# Patient Record
Sex: Female | Born: 1975 | Race: White | Hispanic: No | Smoking: Current every day smoker
Health system: Southern US, Community
[De-identification: ages and names within clinical notes are randomized; demographics above are authoritative.]

## PROBLEM LIST (undated history)

## (undated) DIAGNOSIS — G8929 Other chronic pain: Secondary | ICD-10-CM

## (undated) DIAGNOSIS — E282 Polycystic ovarian syndrome: Secondary | ICD-10-CM

## (undated) DIAGNOSIS — G930 Cerebral cysts: Secondary | ICD-10-CM

## (undated) DIAGNOSIS — F4310 Post-traumatic stress disorder, unspecified: Secondary | ICD-10-CM

## (undated) DIAGNOSIS — R0681 Apnea, not elsewhere classified: Secondary | ICD-10-CM

## (undated) DIAGNOSIS — J45909 Unspecified asthma, uncomplicated: Secondary | ICD-10-CM

## (undated) HISTORY — PX: KIDNEY SURGERY: SHX687

## (undated) HISTORY — PX: BACK SURGERY: SHX140

## (undated) HISTORY — PX: NECK SURGERY: SHX720

---

## 2017-08-04 ENCOUNTER — Encounter: Payer: Self-pay | Admitting: Emergency Medicine

## 2017-08-04 ENCOUNTER — Emergency Department
Admission: EM | Admit: 2017-08-04 | Discharge: 2017-08-04 | Disposition: A | Discharge status: Home / Self Care | Payer: Medicaid Other | Attending: Emergency Medicine | Admitting: Emergency Medicine

## 2017-08-04 ENCOUNTER — Emergency Department: Payer: Medicaid Other

## 2017-08-04 DIAGNOSIS — J45909 Unspecified asthma, uncomplicated: Secondary | ICD-10-CM | POA: Insufficient documentation

## 2017-08-04 DIAGNOSIS — Z91040 Latex allergy status: Secondary | ICD-10-CM | POA: Diagnosis not present

## 2017-08-04 DIAGNOSIS — F17200 Nicotine dependence, unspecified, uncomplicated: Secondary | ICD-10-CM | POA: Diagnosis not present

## 2017-08-04 DIAGNOSIS — R51 Headache: Secondary | ICD-10-CM | POA: Insufficient documentation

## 2017-08-04 DIAGNOSIS — R519 Headache, unspecified: Secondary | ICD-10-CM

## 2017-08-04 HISTORY — DX: Polycystic ovarian syndrome: E28.2

## 2017-08-04 HISTORY — DX: Unspecified asthma, uncomplicated: J45.909

## 2017-08-04 HISTORY — DX: Post-traumatic stress disorder, unspecified: F43.10

## 2017-08-04 HISTORY — DX: Apnea, not elsewhere classified: R06.81

## 2017-08-04 HISTORY — DX: Other chronic pain: G89.29

## 2017-08-04 HISTORY — DX: Cerebral cysts: G93.0

## 2017-08-04 LAB — POC URINE PREG, ED: PREG TEST UR: NEGATIVE

## 2017-08-04 LAB — URINALYSIS, COMPLETE (UACMP) WITH MICROSCOPIC
BACTERIA UA: NONE SEEN
BILIRUBIN URINE: NEGATIVE
Glucose, UA: NEGATIVE mg/dL
HGB URINE DIPSTICK: NEGATIVE
KETONES UR: NEGATIVE mg/dL
LEUKOCYTES UA: NEGATIVE
NITRITE: NEGATIVE
Protein, ur: NEGATIVE mg/dL
SPECIFIC GRAVITY, URINE: 1.021 (ref 1.005–1.030)
pH: 5 (ref 5.0–8.0)

## 2017-08-04 LAB — CBC WITH DIFFERENTIAL/PLATELET
BASOS PCT: 1 %
Basophils Absolute: 0 10*3/uL (ref 0–0.1)
EOS ABS: 0.3 10*3/uL (ref 0–0.7)
EOS PCT: 3 %
HCT: 38.3 % (ref 35.0–47.0)
HEMOGLOBIN: 12.6 g/dL (ref 12.0–16.0)
LYMPHS ABS: 2.4 10*3/uL (ref 1.0–3.6)
Lymphocytes Relative: 26 %
MCH: 28.4 pg (ref 26.0–34.0)
MCHC: 32.9 g/dL (ref 32.0–36.0)
MCV: 86.1 fL (ref 80.0–100.0)
MONOS PCT: 4 %
Monocytes Absolute: 0.4 10*3/uL (ref 0.2–0.9)
NEUTROS PCT: 66 %
Neutro Abs: 6.3 10*3/uL (ref 1.4–6.5)
PLATELETS: 192 10*3/uL (ref 150–440)
RBC: 4.45 MIL/uL (ref 3.80–5.20)
RDW: 17.5 % — AB (ref 11.5–14.5)
WBC: 9.3 10*3/uL (ref 3.6–11.0)

## 2017-08-04 LAB — COMPREHENSIVE METABOLIC PANEL
ALBUMIN: 3.4 g/dL — AB (ref 3.5–5.0)
ALT: 17 U/L (ref 14–54)
ANION GAP: 8 (ref 5–15)
AST: 19 U/L (ref 15–41)
Alkaline Phosphatase: 62 U/L (ref 38–126)
BUN: 12 mg/dL (ref 6–20)
CALCIUM: 8.7 mg/dL — AB (ref 8.9–10.3)
CHLORIDE: 104 mmol/L (ref 101–111)
CO2: 29 mmol/L (ref 22–32)
Creatinine, Ser: 0.73 mg/dL (ref 0.44–1.00)
GFR calc non Af Amer: >60 mL/min (ref >60)
GLUCOSE: 134 mg/dL — AB (ref 65–99)
POTASSIUM: 4.1 mmol/L (ref 3.5–5.1)
SODIUM: 141 mmol/L (ref 135–145)
Total Bilirubin: 0.4 mg/dL (ref 0.3–1.2)
Total Protein: 7 g/dL (ref 6.5–8.1)

## 2017-08-04 MED ORDER — PROMETHAZINE HCL 25 MG/ML IJ SOLN
25.0000 mg | Freq: Once | INTRAMUSCULAR | Status: AC
  Administered 2017-08-04: 25 mg via INTRAVENOUS
  Filled 2017-08-04: qty 1

## 2017-08-04 MED ORDER — OXYCODONE-ACETAMINOPHEN 10-325 MG PO TABS: 1.0000 | ORAL_TABLET | Freq: Four times a day (QID) | ORAL | 0 refills | Status: DC | PRN

## 2017-08-04 MED ORDER — MORPHINE SULFATE (PF) 4 MG/ML IV SOLN
4.0000 mg | Freq: Once | INTRAVENOUS | Status: AC
  Administered 2017-08-04: 4 mg via INTRAVENOUS
  Filled 2017-08-04: qty 1

## 2017-08-04 MED ORDER — CYCLOBENZAPRINE HCL 10 MG PO TABS: 10.0000 mg | ORAL_TABLET | Freq: Three times a day (TID) | ORAL | 0 refills | Status: DC | PRN

## 2017-08-04 MED ORDER — SODIUM CHLORIDE 0.9 % IV BOLUS (SEPSIS)
1000.0000 mL | Freq: Once | INTRAVENOUS | Status: AC
  Administered 2017-08-04: 1000 mL via INTRAVENOUS

## 2017-08-04 NOTE — ED Notes (Signed)
Pt to CT
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
## 2017-08-04 NOTE — ED Notes (Signed)
Pt sleeping upon RN entering room. Pt woke while RN was talking to family members and reported pain had decreased to a=8 out of 10 but verbalized she is still uncomfortable.

## 2017-08-04 NOTE — Discharge Instructions (Signed)
Take motrin for pain.   Take flexeril for muscle spasms.   Take percocet for severe pain. DO NOT drive with it   You need to find a primary care doctor to prescribe your pain medicines   Return to ER if you have worse headaches, vomiting, neck pain, fever

## 2017-09-08 ENCOUNTER — Emergency Department (HOSPITAL_COMMUNITY)
Admission: EM | Admit: 2017-09-08 | Discharge: 2017-09-09 | Disposition: A | Discharge status: Home / Self Care | Payer: Medicaid Other | Attending: Emergency Medicine | Admitting: Emergency Medicine

## 2017-09-08 ENCOUNTER — Encounter (HOSPITAL_COMMUNITY): Payer: Self-pay

## 2017-09-08 DIAGNOSIS — F17200 Nicotine dependence, unspecified, uncomplicated: Secondary | ICD-10-CM | POA: Diagnosis not present

## 2017-09-08 DIAGNOSIS — J45909 Unspecified asthma, uncomplicated: Secondary | ICD-10-CM | POA: Insufficient documentation

## 2017-09-08 DIAGNOSIS — Z91040 Latex allergy status: Secondary | ICD-10-CM | POA: Diagnosis not present

## 2017-09-08 DIAGNOSIS — M542 Cervicalgia: Secondary | ICD-10-CM | POA: Diagnosis not present

## 2017-09-08 NOTE — ED Triage Notes (Signed)
Pt comes today for chronic pain in her head, neck, back and shoulder, pt states that she has had surgery on her neck before and needs to have another one but has not established a doctor here. Pain has got worse in the past two days, pain unrelieved by flexeril and states she is out of her percocet

## 2017-09-09 MED ORDER — OXYCODONE-ACETAMINOPHEN 5-325 MG PO TABS
1.0000 | ORAL_TABLET | ORAL | Status: DC | PRN
  Administered 2017-09-09: 1 via ORAL
  Filled 2017-09-09: qty 1

## 2017-09-09 MED ORDER — OXYCODONE-ACETAMINOPHEN 5-325 MG PO TABS: 1.0000 | ORAL_TABLET | Freq: Three times a day (TID) | ORAL | 0 refills | Status: DC | PRN
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
## 2017-09-09 NOTE — Discharge Instructions (Signed)
As discussed, follow up to establish care with a primary care provider and orthopedic to reschedule your planned surgery locally as soon as possible. Take pain medications as needed for severe pain.  Follow up with wellness center and orthopedics. Return if symptoms worsen or you experience new concerning symptoms in the meantime.

## 2017-09-09 NOTE — ED Notes (Signed)
Patient routinely takes Percocet and Flexeril. Ok for DC.

## 2018-03-17 ENCOUNTER — Emergency Department
Admission: EM | Admit: 2018-03-17 | Discharge: 2018-03-17 | Disposition: A | Discharge status: Home / Self Care | Payer: Medicaid Other | Attending: Emergency Medicine | Admitting: Emergency Medicine

## 2018-03-17 ENCOUNTER — Encounter: Payer: Self-pay | Admitting: Emergency Medicine

## 2018-03-17 DIAGNOSIS — Z91040 Latex allergy status: Secondary | ICD-10-CM | POA: Diagnosis not present

## 2018-03-17 DIAGNOSIS — K029 Dental caries, unspecified: Secondary | ICD-10-CM

## 2018-03-17 DIAGNOSIS — J45909 Unspecified asthma, uncomplicated: Secondary | ICD-10-CM | POA: Insufficient documentation

## 2018-03-17 DIAGNOSIS — K047 Periapical abscess without sinus: Secondary | ICD-10-CM | POA: Diagnosis not present

## 2018-03-17 DIAGNOSIS — K0889 Other specified disorders of teeth and supporting structures: Secondary | ICD-10-CM | POA: Diagnosis present

## 2018-03-17 DIAGNOSIS — F17200 Nicotine dependence, unspecified, uncomplicated: Secondary | ICD-10-CM | POA: Diagnosis not present

## 2018-03-17 MED ORDER — CLINDAMYCIN HCL 150 MG PO CAPS: ORAL_CAPSULE | ORAL | 0 refills | Status: DC

## 2018-03-17 MED ORDER — TRAMADOL HCL 50 MG PO TABS
50.0000 mg | ORAL_TABLET | Freq: Once | ORAL | Status: AC
  Administered 2018-03-17: 50 mg via ORAL

## 2018-03-17 MED ORDER — TRAMADOL HCL 50 MG PO TABS
ORAL_TABLET | ORAL | Status: AC
  Filled 2018-03-17: qty 1

## 2018-03-17 MED ORDER — TRAMADOL HCL 50 MG PO TABS: 50.0000 mg | ORAL_TABLET | Freq: Four times a day (QID) | ORAL | 0 refills | Status: DC | PRN

## 2018-03-17 NOTE — ED Notes (Signed)
Pt to ED with rt upper tooth pain. Pt states she broke her tooth x2weeks ago. Swelling noted.

## 2018-03-17 NOTE — ED Provider Notes (Signed)
[facility] Emergency Department Provider Note  ___________________________________________   First MD Initiated Contact with Patient 03/17/18 1203     (approximate)  I have reviewed the triage vital signs and the nursing notes.   HISTORY  Chief Complaint Dental Pain  HPI Connie Koch is a 42 y.o. female is here with complaint of right sided dental pain.  Patient states that she broke her tooth 2 weeks ago but is having pain and swelling today.  She has not made a dentist appointment if she does not have "dental insurance".  Patient continues to smoke.  She states she has not taken any over-the-counter medication but does state that she has been taking "leftover Percocet" which she ran out of.  Denies any fever or chills.  She rates her pain is not over 10.   Past Medical History:  Diagnosis Date  . Apnea   . Asthma   . Chronic pain   . Cyst of brain   . PCOS (polycystic ovarian syndrome)   . PTSD (post-traumatic stress disorder)     There are no active problems to display for this patient.   Past Surgical History:  Procedure Laterality Date  . BACK SURGERY    . KIDNEY SURGERY    . NECK SURGERY      Prior to Admission medications   Medication Sig Start Date End Date Taking? Authorizing Provider  clindamycin (CLEOCIN) 150 MG capsule Take 2 capsules every 6 hours for infection. 03/17/18   Tommi RumpsSummers, Rhonda L, PA-C  traMADol (ULTRAM) 50 MG tablet Take 1 tablet (50 mg total) by mouth every 6 (six) hours as needed. 03/17/18   Tommi RumpsSummers, Rhonda L, PA-C    Allergies Nsaids; Latex; and Penicillins  No family history on file.  Social History Social History   Tobacco Use  . Smoking status: Current Every Day Smoker  . Smokeless tobacco: Never Used  Substance Use Topics  . Alcohol use: Yes  . Drug use: No    Review of Systems Constitutional: No fever/chills Eyes: No visual changes. ENT: No sore throat.  Positive dental  pain. Cardiovascular: Denies chest pain. Respiratory: Denies shortness of breath. Gastrointestinal: No abdominal pain.  No nausea, no vomiting.  Musculoskeletal: Negative for back pain. Skin: Negative for rash. Neurological: Negative for headaches, focal weakness or numbness. ___________________________________________   PHYSICAL EXAM:  VITAL SIGNS: ED Triage Vitals  Enc Vitals Group     BP 03/17/18 1156 (!) 142/91     Pulse Rate 03/17/18 1156 77     Resp 03/17/18 1156 16     Temp 03/17/18 1156 98.7 F (37.1 C)     Temp Source 03/17/18 1156 Oral     SpO2 03/17/18 1156 93 %     Weight 03/17/18 1151 280 lb (127 kg)     Height 03/17/18 1151 5\' 2"  (1.575 m)     Head Circumference --      Peak Flow --      Pain Score 03/17/18 1151 9     Pain Loc --      Pain Edu? --      Excl. in GC? --    Constitutional: Alert and oriented. Well appearing and in no acute distress. Eyes: Conjunctivae are normal.  Head: Atraumatic. Nose: No congestion/rhinnorhea. Mouth/Throat: Mucous membranes are moist.  Oropharynx non-erythematous.  Right upper premolar there is a cavity present.  There is no obvious signs of infection.  There is no drainage present. Neck: No stridor.  Hematological/Lymphatic/Immunilogical: No cervical lymphadenopathy. Cardiovascular: Normal rate, regular rhythm. Grossly normal heart sounds.  Good peripheral circulation. Respiratory: Normal respiratory effort.  No retractions. Lungs CTAB. Neurologic:  Normal speech and language. No gross focal neurologic deficits are appreciated.  Skin:  Skin is warm, dry and intact. Psychiatric: Mood and affect are normal. Speech and behavior are normal.  ____________________________________________   LABS (all labs ordered are listed, but only abnormal results are displayed)  Labs Reviewed - No data to display   PROCEDURES  Procedure(s) performed: None  Procedures  Critical Care performed:  No  ____________________________________________   INITIAL IMPRESSION / ASSESSMENT AND PLAN / ED COURSE Patient is here with dental pain for 2 weeks.  She was given a prescription for clindamycin and tramadol 50 milligrams 1 every 6 hours as needed for pain.  She was given a list of dental clinics in the area to follow-up with.  ____________________________________________   FINAL CLINICAL IMPRESSION(S) / ED DIAGNOSES  Final diagnoses:  Dental abscess  Pain due to dental caries     ED Discharge Orders        Ordered    traMADol (ULTRAM) 50 MG tablet  Every 6 hours PRN     03/17/18 1226    clindamycin (CLEOCIN) 150 MG capsule     03/17/18 1226       Note:  This document was prepared using Dragon voice recognition software and may include unintentional dictation errors.    Tommi Rumps, PA-C 03/17/18 1244    Governor Rooks, MD 03/17/18 [phone]

## 2018-03-17 NOTE — ED Triage Notes (Signed)
Patient presents to the ED with right sided dental pain.  Patient states she broke her tooth 2 weeks ago but today is having pain and swelling.  Patient is in no obvious distress at this time.

## 2018-03-17 NOTE — Discharge Instructions (Addendum)
Begin taking antibiotics as directed with the clindamycin.  2 capsules every 6 hours for infection.  Tramadol 1 every 6 hours as needed for pain.  Do not drive or operate machinery while taking the tramadol. A list of dental clinics is provided for you.  You will need to see a dentist.  These clinics are on a sliding scale.  Also the dental clinic at [facility] takes walk-ins.  OPTIONS FOR DENTAL FOLLOW UP CARE  [place] Department of Health and Human Services - Local Safety Net Dental Clinics [email or web address]://[email or web address].gov/dph/oralhealth/services/safetynetclinics.htm   [facility] [phone])  [facility] [phone])  [facility] [phone]  [facility] [phone])  [facility] [phone]) This clinic caters to the indigent population and is on a lottery system. Location: [employer] [address] [address], [address], [place] Clinic Hours: Wednesdays from 6pm - 9pm, patients seen by a lottery system. For dates, call or go to [email or web address] Services: Cleanings, fillings and simple extractions. Payment Options: DENTAL WORK IS FREE OF CHARGE. Bring proof of income or support. Best way to get seen: Arrive at 5:15 pm - this is a lottery, NOT first come/first serve, so arriving earlier will not increase your chances of being seen.     [facility] [phone] Select option 1 for emergencies   Location: [facility], [facility], [address], [place] Clinic Hours: No walk-ins accepted - call the day before to schedule an appointment. Check in times are 9:30 am and 1:30 pm. Services: Simple extractions, temporary fillings, pulpectomy/pulp debridement, uncomplicated abscess drainage. Payment Options: PAYMENT IS DUE AT THE TIME OF SERVICE.  Fee is usually $100-200, additional surgical procedures (e.g. abscess drainage) may be  extra. Cash, checks, Visa/MasterCard accepted.  Can file Medicaid if patient is covered for dental - patient should call case worker to check. No discount for [facility] patients. Best way to get seen: MUST call the day before and get onto the schedule. Can usually be seen the next 1-2 days. No walk-ins accepted.     [facility] [phone]   Location: [facility], [address], [place] Clinic Hours: M, W, Th, F 8am or 1:30pm, Tues 9a or 1:30 - first come/first served. Services: Simple extractions, temporary fillings, uncomplicated abscess drainage.  You do not need to be an [facility] resident. Payment Options: PAYMENT IS DUE AT THE TIME OF SERVICE. Dental insurance, otherwise sliding scale - bring proof of income or support. Depending on income and treatment needed, cost is usually $50-200. Best way to get seen: Arrive early as it is first come/first served.     [facility] [phone]   Location: [address] Clinic Hours: Mon-Thu 8a-5p Services: Most basic dental services including extractions and fillings. Payment Options: PAYMENT IS DUE AT THE TIME OF SERVICE. Sliding scale, up to 50% off - bring proof if income or support. Medicaid with dental option accepted. Best way to get seen: Call to schedule an appointment, can usually be seen within 2 weeks OR they will try to see walk-ins - show up at 8a or 2p (you may have to wait).     [facility] [phone] [place] RESIDENTS ONLY   Location: [facility], [address], [place], [place] [address] Clinic Hours: By appointment only. Monday - Thursday 8am-5pm, Friday 8am-12pm Services: Cleanings, fillings, extractions. Payment Options: PAYMENT IS DUE  AT THE TIME OF SERVICE. Cash, Visa or MasterCard. Sliding scale - $30 minimum per service. Best way to get seen: Come in to office,  complete packet and make an appointment - need proof of income or support monies for each household member and proof of [facility] residence. Usually takes about a month to get in.     [facility] [phone]   Location: [address] [place] Clinic Hours: Walk-in Urgent Care Dental Services are offered Monday-Friday mornings only. The numbers of emergencies accepted daily is limited to the number of providers available. Maximum 15 - Mondays, Wednesdays & Thursdays Maximum 10 - Tuesdays & Fridays Services: You do not need to be a [facility] resident to be seen for a dental emergency. Emergencies are defined as pain, swelling, abnormal bleeding, or dental trauma. Walkins will receive x-rays if needed. NOTE: Dental cleaning is not an emergency. Payment Options: PAYMENT IS DUE AT THE TIME OF SERVICE. Minimum co-pay is $40.00 for uninsured patients. Minimum co-pay is $3.00 for Medicaid with dental coverage. Dental Insurance is accepted and must be presented at time of visit. Medicare does not cover dental. Forms of payment: Cash, credit card, checks. Best way to get seen: If not previously registered with the clinic, walk-in dental registration begins at 7:15 am and is on a first come/first serve basis. If previously registered with the clinic, call to make an appointment.     [facility] ONLY   Location: [address] [place], [place] Clinic Hours: Mon-Thu 10a-2p Services: Extractions only! Payment Options: FREE (donations accepted) - bring proof of income or support Best way to get seen: Call and schedule an appointment OR come at 8am on the 1st Monday of every month (except for holidays) when it is first come/first served.     [facility] [phone]   Location: [place], [place] Clinic Hours: Friday mornings Services, Payment Options, Best way to get seen: Call for  info

## 2018-03-29 ENCOUNTER — Ambulatory Visit: Payer: Medicaid Other | Admitting: Pharmacy Technician

## 2018-03-29 DIAGNOSIS — Z79899 Other long term (current) drug therapy: Secondary | ICD-10-CM

## 2018-05-13 ENCOUNTER — Encounter: Payer: Self-pay | Admitting: *Deleted

## 2018-05-13 ENCOUNTER — Emergency Department
Admission: EM | Admit: 2018-05-13 | Discharge: 2018-05-13 | Disposition: A | Discharge status: Home / Self Care | Payer: Medicaid Other | Attending: Emergency Medicine | Admitting: Emergency Medicine

## 2018-05-13 ENCOUNTER — Other Ambulatory Visit: Payer: Self-pay

## 2018-05-13 DIAGNOSIS — F331 Major depressive disorder, recurrent, moderate: Secondary | ICD-10-CM

## 2018-05-13 DIAGNOSIS — Z760 Encounter for issue of repeat prescription: Secondary | ICD-10-CM

## 2018-05-13 DIAGNOSIS — Z91040 Latex allergy status: Secondary | ICD-10-CM | POA: Insufficient documentation

## 2018-05-13 DIAGNOSIS — F4310 Post-traumatic stress disorder, unspecified: Secondary | ICD-10-CM

## 2018-05-13 DIAGNOSIS — Z79899 Other long term (current) drug therapy: Secondary | ICD-10-CM | POA: Insufficient documentation

## 2018-05-13 DIAGNOSIS — J45909 Unspecified asthma, uncomplicated: Secondary | ICD-10-CM | POA: Insufficient documentation

## 2018-05-13 DIAGNOSIS — F17200 Nicotine dependence, unspecified, uncomplicated: Secondary | ICD-10-CM | POA: Insufficient documentation

## 2018-05-13 DIAGNOSIS — F4322 Adjustment disorder with anxiety: Secondary | ICD-10-CM

## 2018-05-13 MED ORDER — HYDROXYZINE HCL 10 MG PO TABS: 20.0000 mg | ORAL_TABLET | Freq: Two times a day (BID) | ORAL | 1 refills | Status: AC | PRN

## 2018-05-13 MED ORDER — FLUOXETINE HCL 20 MG PO CAPS: 20.0000 mg | ORAL_CAPSULE | Freq: Every day | ORAL | 1 refills | Status: AC

## 2018-05-13 NOTE — ED Provider Notes (Signed)
[facility] Emergency Department Provider Note  ____________________________________________  Time seen: Approximately 2:06 PM  I have reviewed the triage vital signs and the nursing notes.   HISTORY  Chief Complaint Medication Refill   HPI Connie Koch is a 42 y.o. female with a history of depression and PTSD who presents for evaluation of depression.  Patient reports that she recently moved here and has been having issues with [facility].  She has been trying to get an appointment for several weeks and is unable to get a hold of them.  She has no insurance and relies on them for her prescriptions. She was finally able to get an appointment to get her medications refilled today but she was 15 minutes late to her appointment and they refused to see her.  Patient has been out of her hydroxyzine and Prozac for a week.  She reports that she has been feeling very depressed, she is not leaving the house, she is been crying a lot, she has not had an appetite.  She denies suicidal homicidal ideation.  She was instructed by the secretary at [facility] to come to the emergency room for evaluation.  She denies any prior history of suicidal homicidal ideation.  Chief Complaint: depression Severity: moderate Duration: 1 week Context: in the setting of running out of her meds Modifying factors: better when she takes her meds Associated signs/symptoms:  No si or HI. + anhedonia, sleep difficulties  Past Medical History:  Diagnosis Date  . Apnea   . Asthma   . Chronic pain   . Cyst of brain   . PCOS (polycystic ovarian syndrome)   . PTSD (post-traumatic stress disorder)      Past Surgical History:  Procedure Laterality Date  . BACK SURGERY    . KIDNEY SURGERY    . NECK SURGERY      Prior to Admission medications   Medication Sig Start Date End Date Taking? Authorizing Provider  clindamycin (CLEOCIN) 150 MG capsule Take 2 capsules every 6 hours for infection.  03/17/18   Tommi Rumps, PA-C  FLUoxetine (PROZAC) 20 MG capsule Take 1 capsule (20 mg total) by mouth daily. 05/13/18   Clapacs, Jackquline Denmark, MD  hydrOXYzine (ATARAX/VISTARIL) 10 MG tablet Take 2 tablets (20 mg total) by mouth every 12 (twelve) hours as needed for anxiety. 05/13/18   Clapacs, Jackquline Denmark, MD  traMADol (ULTRAM) 50 MG tablet Take 1 tablet (50 mg total) by mouth every 6 (six) hours as needed. 03/17/18   Tommi Rumps, PA-C    Allergies Nsaids; Latex; and Penicillins  History reviewed. No pertinent family history.  Social History Social History   Tobacco Use  . Smoking status: Current Every Day Smoker  . Smokeless tobacco: Never Used  Substance Use Topics  . Alcohol use: Yes  . Drug use: No    Review of Systems  Constitutional: Negative for fever. Eyes: Negative for visual changes. ENT: Negative for sore throat. Neck: No neck pain  Cardiovascular: Negative for chest pain. Respiratory: Negative for shortness of breath. Gastrointestinal: Negative for abdominal pain, vomiting or diarrhea. Genitourinary: Negative for dysuria. Musculoskeletal: Negative for back pain. Skin: Negative for rash. Neurological: Negative for headaches, weakness or numbness. Psych: No SI or HI. + depression  ____________________________________________   PHYSICAL EXAM:  VITAL SIGNS: ED Triage Vitals  Enc Vitals Group     BP 05/13/18 1355 126/77     Pulse Rate 05/13/18 1355 94     Resp 05/13/18  1355 16     Temp 05/13/18 1355 98.1 F (36.7 C)     Temp Source 05/13/18 1355 Oral     SpO2 05/13/18 1355 95 %     Weight 05/13/18 1356 280 lb (127 kg)     Height --      Head Circumference --      Peak Flow --      Pain Score 05/13/18 1355 0     Pain Loc --      Pain Edu? --      Excl. in GC? --     Constitutional: Alert and oriented, crying, sad.  HEENT:      Head: Normocephalic and atraumatic.         Eyes: Conjunctivae are normal. Sclera is non-icteric.       Mouth/Throat:  Mucous membranes are moist.       Neck: Supple with no signs of meningismus. Cardiovascular: Regular rate and rhythm. No murmurs, gallops, or rubs. 2+ symmetrical distal pulses are present in all extremities. No JVD. Respiratory: Normal respiratory effort. Lungs are clear to auscultation bilaterally. No wheezes, crackles, or rhonchi.  Gastrointestinal: Soft, non tender, and non distended with positive bowel sounds. No rebound or guarding. Musculoskeletal: Nontender with normal range of motion in all extremities. No edema, cyanosis, or erythema of extremities. Neurologic: Normal speech and language. Face is symmetric. Moving all extremities. No gross focal neurologic deficits are appreciated. Skin: Skin is warm, dry and intact. No rash noted. Psychiatric: Mood and affect are depressed. Patient is teary. Speech and behavior are normal.  ____________________________________________   LABS (all labs ordered are listed, but only abnormal results are displayed)  Labs Reviewed - No data to display ____________________________________________  EKG  none  ____________________________________________  RADIOLOGY  none  ____________________________________________   PROCEDURES  Procedure(s) performed: None Procedures Critical Care performed:  None ____________________________________________   INITIAL IMPRESSION / ASSESSMENT AND PLAN / ED COURSE  42 y.o. female with a history of depression and PTSD who presents for evaluation of depression in the setting of running out of her medications a week ago.  Patient missed her appointment today and only has an appointment available now in a month.  She has been very depressed sick, has anhedonia, decreased appetite, trouble sleeping.  She has no other medical complaints.  She is here voluntarily.  She has no SI or HI and no indication for involuntary commitment.  Will consult psychiatry for evaluation.    _________________________ 2:59 PM on  05/13/2018 ----------------------------------------- Patient evaluated by Dr. Toni Amend and cleared for DC. She was given Rx for her meds by him.     As part of my medical decision making, I reviewed the following data within the electronic MEDICAL RECORD NUMBER Nursing notes reviewed and incorporated, A consult was requested and obtained from this/these consultant(s) Psychiatry, Notes from prior ED visits and [place] Controlled Substance Database    Pertinent labs & imaging results that were available during my care of the patient were reviewed by me and considered in my medical decision making (see chart for details).    ____________________________________________   FINAL CLINICAL IMPRESSION(S) / ED DIAGNOSES  Final diagnoses:  Moderate episode of recurrent major depressive disorder (HCC)  Medication refill      NEW MEDICATIONS STARTED DURING THIS VISIT:  ED Discharge Orders        Ordered    FLUoxetine (PROZAC) 20 MG capsule  Daily     05/13/18 1458    hydrOXYzine (ATARAX/VISTARIL) 10  MG tablet  Every 12 hours PRN     05/13/18 1458       Note:  This document was prepared using Dragon voice recognition software and may include unintentional dictation errors.    Don Perking, [place], MD 05/13/18 [phone]

## 2018-05-13 NOTE — ED Triage Notes (Addendum)
Pt to ED requesting a medication refill for  Hydroxazine and Prozac. Pt attempted to go to [facility] for refill but was told they did not have time to evaluate her and she would have to wait a couple weeks before she could be seen and refills could be prescribed. Pt to ED due to a week without the medication already causing  increased anxiety, decreased appetite and decreased desire to leave the house.  Pt denies SI/HI and denies needing to be evaluated for new psych complaints. Pt verbalized she knows what happens without her medications and just wants to avoid that.
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
## 2018-05-13 NOTE — Discharge Instructions (Signed)
You have been seen in the Emergency Department (ED)  today for a psychiatric complaint.  You have been evaluated by psychiatry and we believe you are safe to be discharged from the hospital.   ° °Please return to the Emergency Department (ED)  immediately if you have ANY thoughts of hurting yourself or anyone else, so that we may help you. ° °Please avoid alcohol and drug use. ° °Follow up with your doctor and/or therapist as soon as possible regarding today's ED  visit.  ° °You may call crisis hotline for [place] at [phone]. ° °

## 2018-05-13 NOTE — ED Notes (Signed)
Psychiatry at bedside.

## 2018-06-09 ENCOUNTER — Emergency Department
Admission: EM | Admit: 2018-06-09 | Discharge: 2018-06-09 | Disposition: A | Discharge status: Home / Self Care | Payer: Self-pay | Attending: Emergency Medicine | Admitting: Emergency Medicine

## 2018-06-09 ENCOUNTER — Other Ambulatory Visit: Payer: Self-pay

## 2018-06-09 ENCOUNTER — Encounter: Payer: Self-pay | Admitting: Emergency Medicine

## 2018-06-09 DIAGNOSIS — Z91040 Latex allergy status: Secondary | ICD-10-CM | POA: Insufficient documentation

## 2018-06-09 DIAGNOSIS — G43909 Migraine, unspecified, not intractable, without status migrainosus: Secondary | ICD-10-CM | POA: Insufficient documentation

## 2018-06-09 DIAGNOSIS — J45909 Unspecified asthma, uncomplicated: Secondary | ICD-10-CM | POA: Insufficient documentation

## 2018-06-09 DIAGNOSIS — F17210 Nicotine dependence, cigarettes, uncomplicated: Secondary | ICD-10-CM | POA: Insufficient documentation

## 2018-06-09 LAB — CBC WITH DIFFERENTIAL/PLATELET
BASOS ABS: 0 10*3/uL (ref 0–0.1)
BASOS PCT: 0 %
Eosinophils Absolute: 0.2 10*3/uL (ref 0–0.7)
Eosinophils Relative: 3 %
HEMATOCRIT: 42.4 % (ref 35.0–47.0)
Hemoglobin: 14.2 g/dL (ref 12.0–16.0)
LYMPHS PCT: 26 %
Lymphs Abs: 2.4 10*3/uL (ref 1.0–3.6)
MCH: 29.7 pg (ref 26.0–34.0)
MCHC: 33.4 g/dL (ref 32.0–36.0)
MCV: 89 fL (ref 80.0–100.0)
MONO ABS: 0.5 10*3/uL (ref 0.2–0.9)
MONOS PCT: 6 %
NEUTROS ABS: 5.8 10*3/uL (ref 1.4–6.5)
Neutrophils Relative %: 65 %
Platelets: 187 10*3/uL (ref 150–440)
RBC: 4.77 MIL/uL (ref 3.80–5.20)
RDW: 15.2 % — AB (ref 11.5–14.5)
WBC: 9 10*3/uL (ref 3.6–11.0)

## 2018-06-09 LAB — COMPREHENSIVE METABOLIC PANEL
ALBUMIN: 3.8 g/dL (ref 3.5–5.0)
ALK PHOS: 69 U/L (ref 38–126)
ALT: 14 U/L (ref 14–54)
AST: 16 U/L (ref 15–41)
Anion gap: 7 (ref 5–15)
BILIRUBIN TOTAL: 0.6 mg/dL (ref 0.3–1.2)
BUN: 13 mg/dL (ref 6–20)
CALCIUM: 8.5 mg/dL — AB (ref 8.9–10.3)
CO2: 27 mmol/L (ref 22–32)
CREATININE: 0.58 mg/dL (ref 0.44–1.00)
Chloride: 104 mmol/L (ref 101–111)
GFR calc Af Amer: >60 mL/min (ref >60)
GLUCOSE: 101 mg/dL — AB (ref 65–99)
POTASSIUM: 4.1 mmol/L (ref 3.5–5.1)
Sodium: 138 mmol/L (ref 135–145)
TOTAL PROTEIN: 7.2 g/dL (ref 6.5–8.1)

## 2018-06-09 MED ORDER — HYDROMORPHONE HCL 1 MG/ML IJ SOLN: 0.5000 mg | Freq: Once | INTRAMUSCULAR | Status: DC

## 2018-06-09 MED ORDER — SODIUM CHLORIDE 0.9 % IV SOLN
Freq: Once | INTRAVENOUS | Status: AC
  Administered 2018-06-09: 14:00:00 via INTRAVENOUS

## 2018-06-09 MED ORDER — METOCLOPRAMIDE HCL 5 MG/ML IJ SOLN
10.0000 mg | Freq: Once | INTRAMUSCULAR | Status: AC
  Administered 2018-06-09: 10 mg via INTRAVENOUS
  Filled 2018-06-09: qty 2

## 2018-06-09 MED ORDER — HYDROMORPHONE HCL 1 MG/ML IJ SOLN
1.0000 mg | Freq: Once | INTRAMUSCULAR | Status: AC
  Administered 2018-06-09: 1 mg via INTRAVENOUS
  Filled 2018-06-09: qty 1

## 2018-06-09 MED ORDER — BUTALBITAL-APAP-CAFFEINE 50-325-40 MG PO TABS: 1.0000 | ORAL_TABLET | Freq: Four times a day (QID) | ORAL | 0 refills | Status: AC | PRN

## 2018-06-09 NOTE — ED Notes (Signed)
Informed RN that patient has been roomed and is ready for evaluation.  Patient in NAD at this time and call bell placed within reach.   

## 2018-06-09 NOTE — ED Triage Notes (Signed)
Pt comes into the ED via POV c/o headache that started yesterday.  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  Patient denies seeing her neurologist recently.  Patient denies any weakness.  Patient has even gait to triage at this time and is neurologically intact.  Patient having photosensitivity but in NAD with even and unlabored respirations.

## 2018-06-09 NOTE — ED Provider Notes (Signed)
[facility] Emergency Department Provider Note       Time seen: ----------------------------------------- 1:01 PM on 06/09/2018 -----------------------------------------   I have reviewed the triage vital signs and the nursing notes.  HISTORY   Chief Complaint Headache    HPI Connie Koch is a 42 y.o. female with a history of apnea, asthma, polycystic ovarian syndrome, PTSD who presents to the ED for headache that started yesterday.  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  Patient states she has recently been treated in [place], has not had scheduled follow-up yet here locally.  She cannot take NSAIDs due to anaphylactic reactions.  Currently is having pain posteriorly that radiates behind the right eye.  Light and sound seem to bother her.  Past Medical History:  Diagnosis Date  . Apnea   . Asthma   . Chronic pain   . Cyst of brain   . PCOS (polycystic ovarian syndrome)   . PTSD (post-traumatic stress disorder)     Patient Active Problem List   Diagnosis Date Noted  . PTSD (post-traumatic stress disorder) 05/13/2018  . Adjustment disorder with anxiety 05/13/2018    Past Surgical History:  Procedure Laterality Date  . BACK SURGERY    . KIDNEY SURGERY    . NECK SURGERY      Allergies Nsaids; Latex; and Penicillins  Social History Social History   Tobacco Use  . Smoking status: Current Every Day Smoker  . Smokeless tobacco: Never Used  Substance Use Topics  . Alcohol use: Yes  . Drug use: No   Review of Systems Constitutional: Negative for fever. Eyes: Positive for light sensitivity ENT:  Negative for congestion, sore throat Cardiovascular: Negative for chest pain. Respiratory: Negative for shortness of breath. Gastrointestinal: Negative for abdominal pain, positive for nausea and vomiting Musculoskeletal: Negative for back pain. Skin: Negative for rash. Neurological: Negative for  headaches, positive for hand paresthesia  All systems negative/normal/unremarkable except as stated in the HPI  ____________________________________________   PHYSICAL EXAM:  VITAL SIGNS: ED Triage Vitals  Enc Vitals Group     BP 06/09/18 1241 120/76     Pulse Rate 06/09/18 1241 66     Resp 06/09/18 1241 18     Temp 06/09/18 1241 97.9 F (36.6 C)     Temp Source 06/09/18 1241 Oral     SpO2 06/09/18 1241 95 %     Weight 06/09/18 1243 275 lb (124.7 kg)     Height 06/09/18 1243 5\' 2"  (1.575 m)     Head Circumference --      Peak Flow --      Pain Score 06/09/18 1244 7     Pain Loc --      Pain Edu? --      Excl. in GC? --    Constitutional: Alert and oriented. Well appearing and in no distress. Eyes: Conjunctivae are normal. Normal extraocular movements.  Mild photophobia is noted ENT   Head: Normocephalic and atraumatic.   Nose: No congestion/rhinnorhea.   Mouth/Throat: Mucous membranes are moist.   Neck: No stridor. Cardiovascular: Normal rate, regular rhythm. No murmurs, rubs, or gallops. Respiratory: Normal respiratory effort without tachypnea nor retractions. Breath sounds are clear and equal bilaterally. No wheezes/rales/rhonchi. Gastrointestinal: Soft and nontender. Normal bowel sounds Musculoskeletal: Nontender with normal range of motion in extremities. No lower extremity tenderness nor edema. Neurologic:  Normal speech and language. No gross focal neurologic deficits are appreciated.  Skin:  Skin  is warm, dry and intact. No rash noted. Psychiatric: Mood and affect are normal. Speech and behavior are normal.  ____________________________________________  ED COURSE:  As part of my medical decision making, I reviewed the following data within the electronic MEDICAL RECORD NUMBER History obtained from family if available, nursing notes, old chart and ekg, as well as notes from prior ED visits. Patient presented for likely migraine headache, we will assess with  labs and assess with IV headache cocktail   Procedures ____________________________________________   LABS (pertinent positives/negatives)  Labs Reviewed  CBC WITH DIFFERENTIAL/PLATELET - Abnormal; Notable for the following components:      Result Value   RDW 15.2 (*)    All other components within normal limits  COMPREHENSIVE METABOLIC PANEL - Abnormal; Notable for the following components:   Glucose, Bld 101 (*)    Calcium 8.5 (*)    All other components within normal limits   ____________________________________________  DIFFERENTIAL DIAGNOSIS   Migraine, tension headache, dehydration, electrolyte abnormality, anemia  FINAL ASSESSMENT AND PLAN  Migraine headache   Plan: The patient had presented for likely migraine headache. Patient's labs were unremarkable.  She has had significant improvement here, I will prescribe Fioricet for her.  She is cleared for outpatient follow-up.   Ulice Dash, MD   Note: This note was generated in part or whole with voice recognition software. Voice recognition is usually quite accurate but there are transcription errors that can and very often do occur. I apologize for any typographical errors that were not detected and corrected.     Emily Filbert, MD 06/09/18 1444

## 2018-08-30 ENCOUNTER — Encounter (INDEPENDENT_AMBULATORY_CARE_PROVIDER_SITE_OTHER): Payer: Self-pay

## 2018-08-30 ENCOUNTER — Ambulatory Visit: Payer: Self-pay | Attending: Oncology | Admitting: *Deleted

## 2018-08-30 VITALS — BP 127/85 | HR 79 | Temp 98.1°F | Ht 64.0 in | Wt 276.0 lb

## 2018-08-30 DIAGNOSIS — Z0000 Encounter for general adult medical examination without abnormal findings: Secondary | ICD-10-CM

## 2018-08-30 NOTE — Progress Notes (Signed)
  Subjective:     Patient ID: Connie Koch, female   DOB: 02-May-1976, 42 y.o.   MRN: [id number]  HPI   Review of Systems     Objective:   Physical Exam  Pulmonary/Chest: Right breast exhibits no inverted nipple, no mass, no nipple discharge, no skin change and no tenderness. Left breast exhibits no inverted nipple, no mass, no nipple discharge, no skin change and no tenderness. Breasts are asymmetrical.         Assessment:     42 year old self referral to [facility].  Patient states her last mammogram was in May 2018 in [place].  States at that time she was informed she needed a 6 month follow-up mammogram but has not returned.  Consent for release of information signed and sent to [place].  Patient is aware that we cannot schedule her mammogram until the images are received per the breast center's protocol.  Last pap per patient was also in [place] in 2018.  Clinical breast exam is unremarkable.  There is no dominant mass, skin changes, nipple discharge or lymphadenopathy.  The right breast is larger than the left.  The patient states this is normal for her.  Taught self breast awareness. Patient has been screened for eligibility.  She does not have any insurance, Medicare or Medicaid.  She also meets financial eligibility.  Hand-out given on the Affordable Care Act. Risk Assessment    Risk Scores      08/30/2018   Last edited by: Jim Like, RN   5-year risk: 0.7 %   Lifetime risk: 11 %             Plan:     Will order the appropriate mammogram when previous images are available.  Patient is to call if she has not heard from [place] with 2 1/2 weeks.  Will follow-up per [facility] protocol.

## 2018-08-30 NOTE — Patient Instructions (Signed)
Gave patient hand-out, Women Staying Healthy, Active and Well from [facility], with education on breast health, pap smears, heart and colon health. 

## 2018-09-06 ENCOUNTER — Other Ambulatory Visit: Payer: Self-pay | Admitting: *Deleted

## 2018-09-06 ENCOUNTER — Inpatient Hospital Stay
Admission: RE | Admit: 2018-09-06 | Discharge: 2018-09-06 | Disposition: A | Discharge status: Home / Self Care | Payer: Self-pay | Source: Ambulatory Visit | Attending: *Deleted | Admitting: *Deleted

## 2018-09-06 DIAGNOSIS — Z9289 Personal history of other medical treatment: Secondary | ICD-10-CM

## 2018-09-07 ENCOUNTER — Other Ambulatory Visit: Payer: Self-pay | Admitting: Physician Assistant

## 2018-09-13 ENCOUNTER — Other Ambulatory Visit: Payer: Self-pay | Admitting: *Deleted

## 2018-09-13 DIAGNOSIS — Z0000 Encounter for general adult medical examination without abnormal findings: Secondary | ICD-10-CM

## 2018-09-21 ENCOUNTER — Ambulatory Visit
Admission: RE | Admit: 2018-09-21 | Discharge: 2018-09-21 | Disposition: A | Discharge status: Home / Self Care | Payer: Self-pay | Source: Ambulatory Visit | Attending: Oncology | Admitting: Oncology

## 2018-09-21 DIAGNOSIS — Z0000 Encounter for general adult medical examination without abnormal findings: Secondary | ICD-10-CM | POA: Insufficient documentation

## 2018-09-23 ENCOUNTER — Encounter: Payer: Self-pay | Admitting: *Deleted

## 2018-09-23 NOTE — Progress Notes (Signed)
Letter mailed from the [facility] to inform patient of her normal mammogram results.  Patient is to follow-up with annual screening in one year.  HSIS to [place]. 

## 2018-11-04 ENCOUNTER — Emergency Department
Admission: EM | Admit: 2018-11-04 | Discharge: 2018-11-04 | Disposition: A | Discharge status: Home / Self Care | Payer: Self-pay | Attending: Emergency Medicine | Admitting: Emergency Medicine

## 2018-11-04 ENCOUNTER — Emergency Department: Payer: Self-pay

## 2018-11-04 ENCOUNTER — Encounter: Payer: Self-pay | Admitting: *Deleted

## 2018-11-04 DIAGNOSIS — F17200 Nicotine dependence, unspecified, uncomplicated: Secondary | ICD-10-CM | POA: Insufficient documentation

## 2018-11-04 DIAGNOSIS — F4322 Adjustment disorder with anxiety: Secondary | ICD-10-CM | POA: Insufficient documentation

## 2018-11-04 DIAGNOSIS — Y929 Unspecified place or not applicable: Secondary | ICD-10-CM | POA: Insufficient documentation

## 2018-11-04 DIAGNOSIS — Y9389 Activity, other specified: Secondary | ICD-10-CM | POA: Insufficient documentation

## 2018-11-04 DIAGNOSIS — Z91040 Latex allergy status: Secondary | ICD-10-CM | POA: Insufficient documentation

## 2018-11-04 DIAGNOSIS — J45909 Unspecified asthma, uncomplicated: Secondary | ICD-10-CM | POA: Insufficient documentation

## 2018-11-04 DIAGNOSIS — S6710XA Crushing injury of unspecified finger(s), initial encounter: Secondary | ICD-10-CM

## 2018-11-04 DIAGNOSIS — Y998 Other external cause status: Secondary | ICD-10-CM | POA: Insufficient documentation

## 2018-11-04 DIAGNOSIS — S6702XA Crushing injury of left thumb, initial encounter: Secondary | ICD-10-CM | POA: Insufficient documentation

## 2018-11-04 DIAGNOSIS — Z79899 Other long term (current) drug therapy: Secondary | ICD-10-CM | POA: Insufficient documentation

## 2018-11-04 DIAGNOSIS — W231XXA Caught, crushed, jammed, or pinched between stationary objects, initial encounter: Secondary | ICD-10-CM | POA: Insufficient documentation

## 2018-11-04 MED ORDER — HYDROCODONE-ACETAMINOPHEN 5-325 MG PO TABS: 1.0000 | ORAL_TABLET | Freq: Four times a day (QID) | ORAL | 0 refills | Status: AC | PRN

## 2018-11-04 NOTE — ED Triage Notes (Signed)
PT to ED reporting she closed her left hand in a door a week ago and has  Continued pain and swelling to the left thumb.

## 2018-11-04 NOTE — Discharge Instructions (Signed)
Please follow up with orthopedics if not improving over the next few days.  Return to the ER for symptoms that change or worsen if unable to schedule an appointment.

## 2018-11-04 NOTE — ED Provider Notes (Signed)
[facility] Emergency Department Provider Note ____________________________________________  Time seen: Approximately 1:55 PM  I have reviewed the triage vital signs and the nursing notes.   HISTORY  Chief Complaint Finger Injury    HPI Connie Koch is a 42 y.o. female who presents to the emergency department for evaluation and treatment of hand injury after shutting her left hand in a door a week ago. She continues to have pain and swelling to the left thumb.   Past Medical History:  Diagnosis Date  . Apnea   . Asthma   . Chronic pain   . Cyst of brain   . PCOS (polycystic ovarian syndrome)   . PTSD (post-traumatic stress disorder)     Patient Active Problem List   Diagnosis Date Noted  . PTSD (post-traumatic stress disorder) 05/13/2018  . Adjustment disorder with anxiety 05/13/2018    Past Surgical History:  Procedure Laterality Date  . BACK SURGERY    . KIDNEY SURGERY    . NECK SURGERY      Prior to Admission medications   Medication Sig Start Date End Date Taking? Authorizing Provider  butalbital-acetaminophen-caffeine (FIORICET, ESGIC) [phone] MG tablet Take 1-2 tablets by mouth every 6 (six) hours as needed for headache. 06/09/18 06/09/19  Emily FilbertWilliams, Jonathan E, MD  clindamycin (CLEOCIN) 150 MG capsule Take 2 capsules every 6 hours for infection. 03/17/18   Tommi RumpsSummers, Rhonda L, PA-C  FLUoxetine (PROZAC) 20 MG capsule Take 1 capsule (20 mg total) by mouth daily. 05/13/18   Clapacs, Jackquline DenmarkJohn T, MD  HYDROcodone-acetaminophen (NORCO/VICODIN) 5-325 MG tablet Take 1 tablet by mouth every 6 (six) hours as needed for up to 3 days for severe pain. 11/04/18 11/07/18  Charvis Lightner, Rulon Eisenmengerari B, FNP  hydrOXYzine (ATARAX/VISTARIL) 10 MG tablet Take 2 tablets (20 mg total) by mouth every 12 (twelve) hours as needed for anxiety. 05/13/18   Clapacs, Jackquline DenmarkJohn T, MD  traMADol (ULTRAM) 50 MG tablet Take 1 tablet (50 mg total) by mouth every 6 (six) hours as needed.  03/17/18   Tommi RumpsSummers, Rhonda L, PA-C    Allergies Nsaids; Latex; and Penicillins  Family History  Problem Relation Age of Onset  . Breast cancer Paternal Grandmother     Social History Social History   Tobacco Use  . Smoking status: Current Every Day Smoker  . Smokeless tobacco: Never Used  Substance Use Topics  . Alcohol use: Yes  . Drug use: No    Review of Systems Constitutional: Negative for fever. Cardiovascular: Negative for chest pain. Respiratory: Negative for shortness of breath. Musculoskeletal: Positive for left thumb pain. Skin: Positive for diffuse swelling of the left thumb.  Neurological: Negative for decrease in sensation  ____________________________________________   PHYSICAL EXAM:  VITAL SIGNS: ED Triage Vitals  Enc Vitals Group     BP 11/04/18 1345 (!) 156/92     Pulse Rate 11/04/18 1345 96     Resp 11/04/18 1345 16     Temp 11/04/18 1345 98.2 F (36.8 C)     Temp Source 11/04/18 1345 Oral     SpO2 11/04/18 1345 99 %     Weight 11/04/18 1346 276 lb 0.3 oz (125.2 kg)     Height --      Head Circumference --      Peak Flow --      Pain Score 11/04/18 1345 8     Pain Loc --      Pain Edu? --      Excl. in GC? --  Constitutional: Alert and oriented. Well appearing and in no acute distress. Eyes: Conjunctivae are clear without discharge or drainage Head: Atraumatic Neck: Supple Respiratory: No cough. Respirations are even and unlabored. Musculoskeletal: Limited ROM of the left thumb. Neurologic: Neurovascularly intact.  Skin:Limited ROM of the left thumb. Diffuse swelling of the MCP and PIP.   Psychiatric: Affect and behavior are appropriate.  ____________________________________________   LABS (all labs ordered are listed, but only abnormal results are displayed)  Labs Reviewed - No data to display ____________________________________________  RADIOLOGY  No acute bony abnormality of the left  thumb. ____________________________________________   PROCEDURES  .Ortho Injury Treatment Date/Time: 11/04/2018 3:06 PM Performed by: Rosalio Loud Authorized by: Chinita Pester, FNP   Consent:    Consent obtained:  Verbal   Consent given by:  Patient   Risks discussed:  Restricted joint movementInjury location: finger Location details: left thumb Injury type: soft tissue Pre-procedure distal perfusion: normal Pre-procedure neurological function: normal Pre-procedure range of motion: reduced  Anesthesia: Local anesthesia used: no Immobilization: splint Splint type: static finger Supplies used: aluminum splint Post-procedure distal perfusion: normal Post-procedure neurological function: normal Post-procedure range of motion: unchanged     ____________________________________________   INITIAL IMPRESSION / ASSESSMENT AND PLAN / ED COURSE  Connie Koch is a 42 y.o. who presents to the emergency department for treatment and evaluation of left thumb pain after shutting it in a door over a week ago. X-ray is reassuring.  Patient instructed to follow-up with orthopedics if not improving over the week.  She was also instructed to return to the emergency department for symptoms that change or worsen if unable schedule an appointment with orthopedics or primary care.  Medications - No data to display  Pertinent labs & imaging results that were available during my care of the patient were reviewed by me and considered in my medical decision making (see chart for details).  _________________________________________   FINAL CLINICAL IMPRESSION(S) / ED DIAGNOSES  Final diagnoses:  Crushing injury of finger, initial encounter    ED Discharge Orders         Ordered    HYDROcodone-acetaminophen (NORCO/VICODIN) 5-325 MG tablet  Every 6 hours PRN     11/04/18 1448           If controlled substance prescribed during this visit, 12 month history viewed on  the [facility] prior to issuing an initial prescription for Schedule II or III opiod.    Chinita Pester, FNP 11/04/18 1508    Jeanmarie Plant, MD 11/04/18 [phone]

## 2018-11-04 NOTE — ED Notes (Signed)
Radiology to bedside. 

## 2019-03-23 IMAGING — DX DG FINGER THUMB 2+V*L*
3 series · 3 of 3 positions shown · non-contrast
Comparison: No prior.

CLINICAL DATA: Jammed left thumb.

EXAM:
LEFT THUMB 2+V

[finger ap]
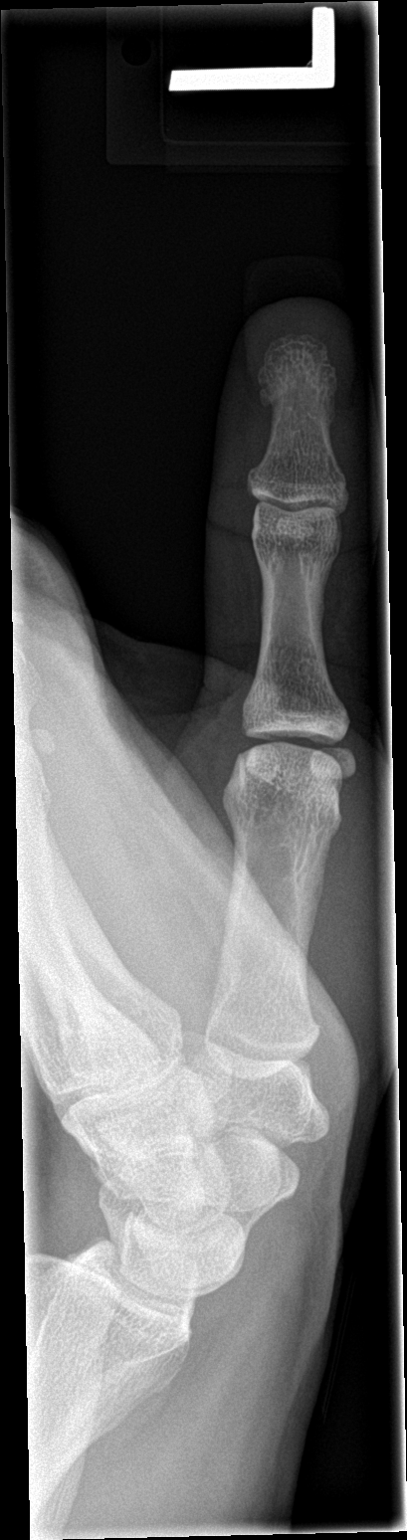

[finger obl]
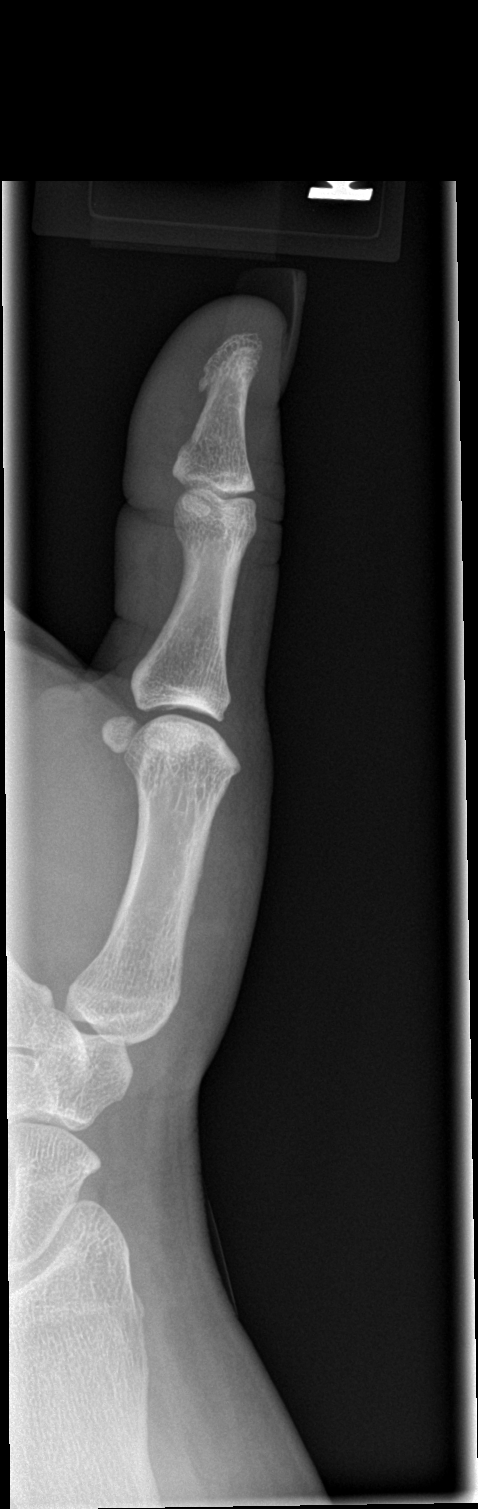

[finger lat]
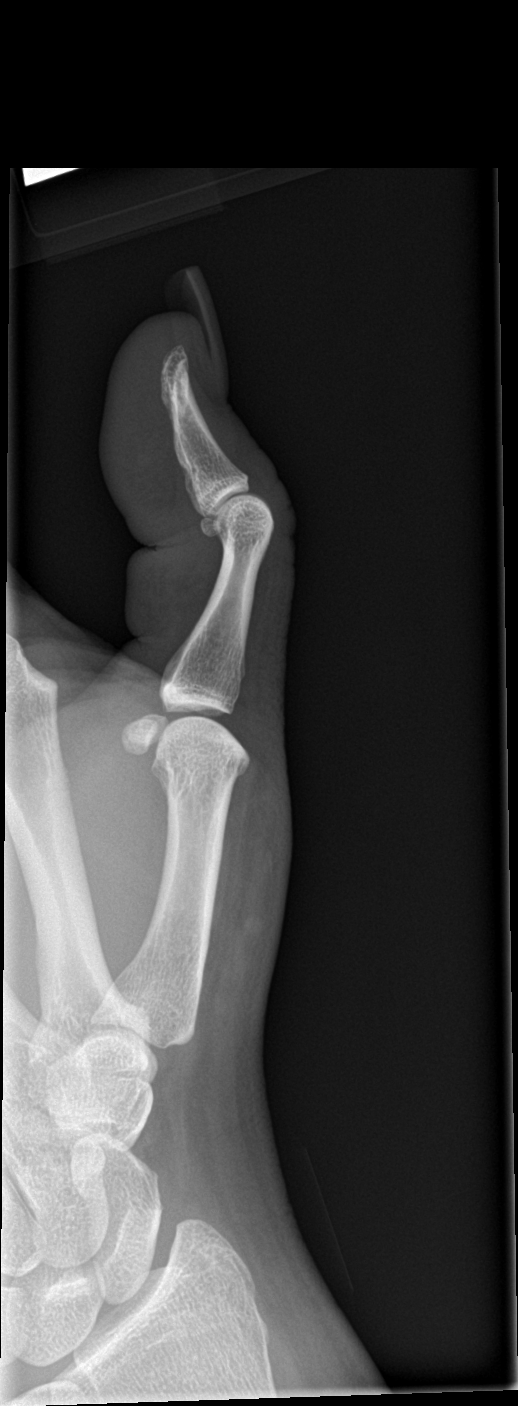

[3 of 3 positions shown; findings below may reference images not displayed]

FINDINGS: No acute or focal bony abnormality identified. No evidence of
fracture or dislocation.
IMPRESSION: No acute or focal abnormality.

## 2019-05-11 ENCOUNTER — Other Ambulatory Visit: Payer: Self-pay

## 2019-05-11 ENCOUNTER — Encounter: Payer: Self-pay | Admitting: Emergency Medicine

## 2019-05-11 ENCOUNTER — Emergency Department
Admission: EM | Admit: 2019-05-11 | Discharge: 2019-05-11 | Disposition: A | Discharge status: Home / Self Care | Payer: Self-pay | Attending: Emergency Medicine | Admitting: Emergency Medicine

## 2019-05-11 ENCOUNTER — Emergency Department: Payer: Self-pay

## 2019-05-11 DIAGNOSIS — W2209XA Striking against other stationary object, initial encounter: Secondary | ICD-10-CM | POA: Insufficient documentation

## 2019-05-11 DIAGNOSIS — J45909 Unspecified asthma, uncomplicated: Secondary | ICD-10-CM | POA: Insufficient documentation

## 2019-05-11 DIAGNOSIS — Z91040 Latex allergy status: Secondary | ICD-10-CM | POA: Insufficient documentation

## 2019-05-11 DIAGNOSIS — Y929 Unspecified place or not applicable: Secondary | ICD-10-CM | POA: Insufficient documentation

## 2019-05-11 DIAGNOSIS — S60221A Contusion of right hand, initial encounter: Secondary | ICD-10-CM

## 2019-05-11 DIAGNOSIS — Y999 Unspecified external cause status: Secondary | ICD-10-CM | POA: Insufficient documentation

## 2019-05-11 DIAGNOSIS — Z79899 Other long term (current) drug therapy: Secondary | ICD-10-CM | POA: Insufficient documentation

## 2019-05-11 DIAGNOSIS — Y939 Activity, unspecified: Secondary | ICD-10-CM | POA: Insufficient documentation

## 2019-05-11 DIAGNOSIS — F17200 Nicotine dependence, unspecified, uncomplicated: Secondary | ICD-10-CM | POA: Insufficient documentation

## 2019-05-11 MED ORDER — OXYCODONE-ACETAMINOPHEN 5-325 MG PO TABS
1.0000 | ORAL_TABLET | Freq: Once | ORAL | Status: AC
  Administered 2019-05-11: 1 via ORAL
  Filled 2019-05-11: qty 1

## 2019-05-11 NOTE — ED Triage Notes (Signed)
Patient ambulatory to triage with steady gait, without difficulty or distress noted; pt reports injuring right hand after "slamming it against a metal door" hr PTA

## 2019-05-11 NOTE — ED Provider Notes (Signed)
[facility] Emergency Department Provider Note __________________________   First MD Initiated Contact with Patient 05/11/19 [phone]     (approximate)  I have reviewed the triage vital signs and the nursing notes.   HISTORY  Chief Complaint Hand Injury   HPI Connie Koch is a 43 y.o. female presents to the emergency department with history of accidentally tripping and falling over her dog striking her right hand on a metal door with resultant 9 out of 10 pain to the base of the right fifth finger.        Past Medical History:  Diagnosis Date  . Apnea   . Asthma   . Chronic pain   . Cyst of brain   . PCOS (polycystic ovarian syndrome)   . PTSD (post-traumatic stress disorder)     Patient Active Problem List   Diagnosis Date Noted  . PTSD (post-traumatic stress disorder) 05/13/2018  . Adjustment disorder with anxiety 05/13/2018    Past Surgical History:  Procedure Laterality Date  . BACK SURGERY    . KIDNEY SURGERY    . NECK SURGERY      Prior to Admission medications   Medication Sig Start Date End Date Taking? Authorizing Provider  butalbital-acetaminophen-caffeine (FIORICET, ESGIC) [phone] MG tablet Take 1-2 tablets by mouth every 6 (six) hours as needed for headache. 06/09/18 06/09/19  Emily FilbertWilliams, Jonathan E, MD  clindamycin (CLEOCIN) 150 MG capsule Take 2 capsules every 6 hours for infection. 03/17/18   Tommi RumpsSummers, Rhonda L, PA-C  FLUoxetine (PROZAC) 20 MG capsule Take 1 capsule (20 mg total) by mouth daily. 05/13/18   Clapacs, Jackquline DenmarkJohn T, MD  hydrOXYzine (ATARAX/VISTARIL) 10 MG tablet Take 2 tablets (20 mg total) by mouth every 12 (twelve) hours as needed for anxiety. 05/13/18   Clapacs, Jackquline DenmarkJohn T, MD  traMADol (ULTRAM) 50 MG tablet Take 1 tablet (50 mg total) by mouth every 6 (six) hours as needed. 03/17/18   Tommi RumpsSummers, Rhonda L, PA-C    Allergies Nsaids; Latex; and Penicillins  Family History  Problem Relation Age of Onset  . Breast  cancer Paternal Grandmother     Social History Social History   Tobacco Use  . Smoking status: Current Every Day Smoker  . Smokeless tobacco: Never Used  Substance Use Topics  . Alcohol use: Yes  . Drug use: No    Review of Systems Constitutional: No fever/chills Eyes: No visual changes. ENT: No sore throat. Cardiovascular: Denies chest pain. Respiratory: Denies shortness of breath. Gastrointestinal: No abdominal pain.  No nausea, no vomiting.  No diarrhea.  No constipation. Genitourinary: Negative for dysuria. Musculoskeletal: Positive for right hand pain. Integumentary: Negative for rash. Neurological: Negative for headaches, focal weakness or numbness.   ____________________________________________   PHYSICAL EXAM:  VITAL SIGNS: ED Triage Vitals  Enc Vitals Group     BP 05/11/19 0338 (!) 152/98     Pulse Rate 05/11/19 0338 96     Resp 05/11/19 0338 20     Temp 05/11/19 0338 98.3 F (36.8 C)     Temp Source 05/11/19 0338 Oral     SpO2 05/11/19 0338 91 %     Weight 05/11/19 0335 127 kg (280 lb)     Height 05/11/19 0335 1.575 m (5\' 2" )     Head Circumference --      Peak Flow --      Pain Score 05/11/19 0334 9     Pain Loc --      Pain Edu? --  Excl. in GC? --     Constitutional: Alert and oriented. Well appearing and in no acute distress. Eyes: Conjunctivae are normal.  Head: Atraumatic. Mouth/Throat: Mucous membranes are moist.  Oropharynx non-erythematous. Neck: No stridor.  Cardiovascular: Normal rate, regular rhythm. Good peripheral circulation. Grossly normal heart sounds. Respiratory: Normal respiratory effort.  No retractions. No audible wheezing. Gastrointestinal: Soft and nontender. No distention.  Musculoskeletal: Swelling and ecchymosis noted dorsal aspect of the fifth metacarpal Neurologic:  Normal speech and language. No gross focal neurologic deficits are appreciated.  Skin:  Skin is warm, dry and intact. No rash noted.    ____________ RADIOLOGY I, Darci Current, personally viewed and evaluated these images (plain radiographs) as part of my medical decision making, as well as reviewing the written report by the radiologist.  ED MD interpretation:  Negative right hand xray  Official radiology report(s): Dg Hand Complete Right  Result Date: 05/11/2019 CLINICAL DATA:  Right hand injury. EXAM: RIGHT HAND - COMPLETE 3+ VIEW COMPARISON:  None. FINDINGS: There is no evidence of fracture or dislocation. There is no evidence of arthropathy or other focal bone abnormality. Soft tissues are unremarkable. IMPRESSION: Negative. Electronically Signed   By: Marnee Spring M.D.   On: 05/11/2019 04:18     Procedures   ____________________________________________   INITIAL IMPRESSION / MDM / ASSESSMENT AND PLAN / ED COURSE  As part of my medical decision making, I reviewed the following data within the electronic MEDICAL RECORD NUMBER   43 year old female present with above-stated history and physical exam secondary to right hand pain.  Concern for possible fracture.  X-ray revealed no evidence of fracture.  *Connie Koch was evaluated in Emergency Department on 05/11/2019 for the symptoms described in the history of present illness. She was evaluated in the context of the global COVID-19 pandemic, which necessitated consideration that the patient might be at risk for infection with the SARS-CoV-2 virus that causes COVID-19. Institutional protocols and algorithms that pertain to the evaluation of patients at risk for COVID-19 are in a state of rapid change based on information released by regulatory bodies including the CDC and federal and state organizations. These policies and algorithms were followed during the patient's care in the ED.  Some ED evaluations and interventions may be delayed as a result of limited staffing during the pandemic.*        ____________________________________________  FINAL  CLINICAL IMPRESSION(S) / ED DIAGNOSES  Final diagnoses:  Contusion of right hand, initial encounter     MEDICATIONS GIVEN DURING THIS VISIT:  Medications  oxyCODONE-acetaminophen (PERCOCET/ROXICET) 5-325 MG per tablet 1 tablet (1 tablet Oral Given 05/11/19 0425)     ED Discharge Orders    None       Note:  This document was prepared using Dragon voice recognition software and may include unintentional dictation errors.   Darci Current, MD 05/11/19 [phone]

## 2019-05-28 ENCOUNTER — Other Ambulatory Visit: Payer: Self-pay

## 2019-05-28 ENCOUNTER — Encounter: Payer: Self-pay | Admitting: Emergency Medicine

## 2019-05-28 ENCOUNTER — Emergency Department: Payer: Self-pay

## 2019-05-28 ENCOUNTER — Emergency Department
Admission: EM | Admit: 2019-05-28 | Discharge: 2019-05-28 | Disposition: A | Discharge status: Home / Self Care | Payer: Self-pay | Attending: Emergency Medicine | Admitting: Emergency Medicine

## 2019-05-28 DIAGNOSIS — S93602A Unspecified sprain of left foot, initial encounter: Secondary | ICD-10-CM | POA: Insufficient documentation

## 2019-05-28 DIAGNOSIS — F17210 Nicotine dependence, cigarettes, uncomplicated: Secondary | ICD-10-CM | POA: Insufficient documentation

## 2019-05-28 DIAGNOSIS — Z91040 Latex allergy status: Secondary | ICD-10-CM | POA: Insufficient documentation

## 2019-05-28 DIAGNOSIS — W010XXA Fall on same level from slipping, tripping and stumbling without subsequent striking against object, initial encounter: Secondary | ICD-10-CM | POA: Insufficient documentation

## 2019-05-28 DIAGNOSIS — Z79899 Other long term (current) drug therapy: Secondary | ICD-10-CM | POA: Insufficient documentation

## 2019-05-28 DIAGNOSIS — Y999 Unspecified external cause status: Secondary | ICD-10-CM | POA: Insufficient documentation

## 2019-05-28 DIAGNOSIS — Y929 Unspecified place or not applicable: Secondary | ICD-10-CM | POA: Insufficient documentation

## 2019-05-28 DIAGNOSIS — Y939 Activity, unspecified: Secondary | ICD-10-CM | POA: Insufficient documentation

## 2019-05-28 MED ORDER — PREDNISONE 50 MG PO TABS: 50.0000 mg | ORAL_TABLET | Freq: Every day | ORAL | 0 refills | Status: DC

## 2019-05-28 NOTE — ED Triage Notes (Signed)
Pt seen here on 05/11/19 after tripping over her dog; was seen for right hand pain but did not mention the right foot pain; pt says now she's back to work and the foot pain is worse; swelling present; pt says she tripped over her dog again today;

## 2019-05-28 NOTE — ED Notes (Signed)
Patient's L foot is slightly swollen, notified patient that she will be getting an xray

## 2019-05-28 NOTE — ED Notes (Signed)
Patient given crutches and post-op shoes

## 2019-05-28 NOTE — ED Provider Notes (Signed)
[facility] Emergency Department Provider Note  ____________________________________________  Time seen: Approximately 8:56 PM  I have reviewed the triage vital signs and the nursing notes.   HISTORY  Chief Complaint Foot Pain    HPI Connie Koch is a 43 y.o. female who presents the emergency department complaining of left foot pain.  Patient tripped over her dog 2 weeks ago.  She presented to the emergency department complaining of right wrist pain and was evaluated did not report the foot pain at that time.  Patient reports that she has had ongoing pain, swelling of the foot.  Primarily pain is located behind the second, third, fourth MTP joints.  Patient reports that she is able to bear weight and limited amount spent long periods of standing or walking drastically increase her pain.  No other injury or complaint at this time.         Past Medical History:  Diagnosis Date  . Apnea   . Asthma   . Chronic pain   . Cyst of brain   . PCOS (polycystic ovarian syndrome)   . PTSD (post-traumatic stress disorder)     Patient Active Problem List   Diagnosis Date Noted  . PTSD (post-traumatic stress disorder) 05/13/2018  . Adjustment disorder with anxiety 05/13/2018    Past Surgical History:  Procedure Laterality Date  . BACK SURGERY    . KIDNEY SURGERY    . NECK SURGERY      Prior to Admission medications   Medication Sig Start Date End Date Taking? Authorizing Provider  butalbital-acetaminophen-caffeine (FIORICET, ESGIC) [phone] MG tablet Take 1-2 tablets by mouth every 6 (six) hours as needed for headache. 06/09/18 06/09/19 Yes Emily FilbertWilliams, Robert Sunga E, MD  FLUoxetine (PROZAC) 20 MG capsule Take 1 capsule (20 mg total) by mouth daily. 05/13/18  Yes Clapacs, Jackquline DenmarkJohn T, MD  hydrOXYzine (ATARAX/VISTARIL) 10 MG tablet Take 2 tablets (20 mg total) by mouth every 12 (twelve) hours as needed for anxiety. 05/13/18  Yes Clapacs, Jackquline DenmarkJohn T, MD  traMADol  (ULTRAM) 50 MG tablet Take 1 tablet (50 mg total) by mouth every 6 (six) hours as needed. 03/17/18  Yes Tommi RumpsSummers, Rhonda L, PA-C  predniSONE (DELTASONE) 50 MG tablet Take 1 tablet (50 mg total) by mouth daily with breakfast. 05/28/19   Cesily Cuoco, Delorise RoyalsJonathan D, PA-C    Allergies Nsaids; Latex; and Penicillins  Family History  Problem Relation Age of Onset  . Breast cancer Paternal Grandmother     Social History Social History   Tobacco Use  . Smoking status: Current Every Day Smoker  . Smokeless tobacco: Never Used  Substance Use Topics  . Alcohol use: Yes  . Drug use: No     Review of Systems  Constitutional: No fever/chills Eyes: No visual changes. No discharge ENT: No upper respiratory complaints. Cardiovascular: no chest pain. Respiratory: no cough. No SOB. Gastrointestinal: No abdominal pain.  No nausea, no vomiting.  No diarrhea.  No constipation. Musculoskeletal: Positive for ongoing left foot pain after injury 2 weeks ago Skin: Negative for rash, abrasions, lacerations, ecchymosis. Neurological: Negative for headaches, focal weakness or numbness. 10-point ROS otherwise negative.  ____________________________________________   PHYSICAL EXAM:  VITAL SIGNS: ED Triage Vitals  Enc Vitals Group     BP 05/28/19 2040 (!) 164/88     Pulse Rate 05/28/19 2040 (!) 110     Resp 05/28/19 2040 17     Temp 05/28/19 2040 98.2 F (36.8 C)     Temp Source 05/28/19 2040  Oral     SpO2 05/28/19 2040 95 %     Weight 05/28/19 2041 279 lb 15.8 oz (127 kg)     Height 05/28/19 2041 5\' 2"  (1.575 m)     Head Circumference --      Peak Flow --      Pain Score 05/28/19 2041 7     Pain Loc --      Pain Edu? --      Excl. in GC? --      Constitutional: Alert and oriented. Well appearing and in no acute distress. Eyes: Conjunctivae are normal. PERRL. EOMI. Head: Atraumatic. Neck: No stridor.    Cardiovascular: Normal rate, regular rhythm. Normal S1 and S2.  Good peripheral  circulation. Respiratory: Normal respiratory effort without tachypnea or retractions. Lungs CTAB. Good air entry to the bases with no decreased or absent breath sounds. Musculoskeletal: Full range of motion to all extremities. No gross deformities appreciated.  Examination of the left foot reveals mild edema along the tarsal/metatarsal joint.  Patient has no appreciable edema or ecchymosis over the MTP joints.  Patient is minimally tender to palpation along the second, third, fourth tarsometatarsal junction.  Patient has very tender to palpation over the distal metatarsal bones of the second, third, fourth.  Dorsalis pedis pulse intact.  Sensation intact all digits.  Capillary refill less than 2 seconds all digits. Neurologic:  Normal speech and language. No gross focal neurologic deficits are appreciated.  Skin:  Skin is warm, dry and intact. No rash noted. Psychiatric: Mood and affect are normal. Speech and behavior are normal. Patient exhibits appropriate insight and judgement.   ____________________________________________   LABS (all labs ordered are listed, but only abnormal results are displayed)  Labs Reviewed - No data to display ____________________________________________  EKG   ____________________________________________  RADIOLOGY I personally viewed and evaluated these images as part of my medical decision making, as well as reviewing the written report by the radiologist.  I concur with radiologist finding of no acute osseous abnormality to the left foot  Dg Foot Complete Left  Result Date: 05/28/2019 CLINICAL DATA:  43 year old female with fall and left foot pain. EXAM: LEFT FOOT - COMPLETE 3+ VIEW COMPARISON:  None. FINDINGS: There is no acute fracture or dislocation. The bones are well mineralized. No arthritic changes. Mild soft tissue swelling of the midfoot. No radiopaque foreign object or soft tissue gas. IMPRESSION: No acute/traumatic osseous pathology. Electronically  Signed   By: Elgie CollardArash  Radparvar M.D.   On: 05/28/2019 21:32    ____________________________________________    PROCEDURES  Procedure(s) performed:    Procedures    Medications - No data to display   ____________________________________________   INITIAL IMPRESSION / ASSESSMENT AND PLAN / ED COURSE  Pertinent labs & imaging results that were available during my care of the patient were reviewed by me and considered in my medical decision making (see chart for details).  Review of the [place] CSRS was performed in accordance of the [facility] prior to dispensing any controlled drugs.           Patient's diagnosis is consistent with sprain of the left foot.  Patient presented to the emergency department complaining of left foot pain ongoing x2-1/2 weeks.  Patient reports ongoing foot pain that has not resolved.  Exam is overall reassuring.  X-ray reveals no acute osseous abnormality.  Patient is given postop shoe and crutches and referred to podiatry.  Patient is allergic to NSAIDs and will be placed on a  course of steroids. Patient is given ED precautions to return to the ED for any worsening or new symptoms.     ____________________________________________  FINAL CLINICAL IMPRESSION(S) / ED DIAGNOSES  Final diagnoses:  Sprain of left foot, initial encounter      NEW MEDICATIONS STARTED DURING THIS VISIT:  ED Discharge Orders         Ordered    predniSONE (DELTASONE) 50 MG tablet  Daily with breakfast     05/28/19 2211              This chart was dictated using voice recognition software/Dragon. Despite best efforts to proofread, errors can occur which can change the meaning. Any change was purely unintentional.    Darletta Moll, PA-C 05/28/19 2345    Nena Polio, MD 05/29/19 Laureen Abrahams

## 2019-12-31 ENCOUNTER — Emergency Department
Admission: EM | Admit: 2019-12-31 | Discharge: 2019-12-31 | Disposition: A | Discharge status: Home / Self Care | Payer: Self-pay | Attending: Emergency Medicine | Admitting: Emergency Medicine

## 2019-12-31 ENCOUNTER — Other Ambulatory Visit: Payer: Self-pay

## 2019-12-31 DIAGNOSIS — Z5321 Procedure and treatment not carried out due to patient leaving prior to being seen by health care provider: Secondary | ICD-10-CM | POA: Insufficient documentation

## 2019-12-31 DIAGNOSIS — R519 Headache, unspecified: Secondary | ICD-10-CM | POA: Insufficient documentation

## 2019-12-31 NOTE — ED Notes (Signed)
Patient called times one for a room with no answer.

## 2019-12-31 NOTE — ED Notes (Signed)
Pt called by this RN with no answer

## 2019-12-31 NOTE — ED Triage Notes (Signed)
Patient reports migraine for 3 days and is out of her regular medications.

## 2019-12-31 NOTE — ED Notes (Signed)
First Nurse: patient with complaint of migraine times three days and states that she ran out of her medications.

## 2019-12-31 NOTE — ED Notes (Signed)
3 attempts to call pt back to room. Taken off the board

## 2020-01-01 ENCOUNTER — Emergency Department: Payer: Self-pay

## 2020-01-01 ENCOUNTER — Other Ambulatory Visit: Payer: Self-pay

## 2020-01-01 ENCOUNTER — Ambulatory Visit
Admission: EM | Admit: 2020-01-01 | Discharge: 2020-01-01 | Payer: Self-pay | Attending: Family Medicine | Admitting: Family Medicine

## 2020-01-01 ENCOUNTER — Emergency Department
Admission: EM | Admit: 2020-01-01 | Discharge: 2020-01-01 | Disposition: A | Discharge status: Home / Self Care | Payer: Self-pay | Attending: Emergency Medicine | Admitting: Emergency Medicine

## 2020-01-01 ENCOUNTER — Encounter: Payer: Self-pay | Admitting: Emergency Medicine

## 2020-01-01 DIAGNOSIS — K1379 Other lesions of oral mucosa: Secondary | ICD-10-CM | POA: Insufficient documentation

## 2020-01-01 DIAGNOSIS — R1310 Dysphagia, unspecified: Secondary | ICD-10-CM

## 2020-01-01 DIAGNOSIS — T17308A Unspecified foreign body in larynx causing other injury, initial encounter: Secondary | ICD-10-CM

## 2020-01-01 DIAGNOSIS — Z79899 Other long term (current) drug therapy: Secondary | ICD-10-CM | POA: Insufficient documentation

## 2020-01-01 DIAGNOSIS — F17210 Nicotine dependence, cigarettes, uncomplicated: Secondary | ICD-10-CM | POA: Insufficient documentation

## 2020-01-01 LAB — CBC WITH DIFFERENTIAL/PLATELET
Abs Immature Granulocytes: 0.11 10*3/uL — ABNORMAL HIGH (ref 0.00–0.07)
Basophils Absolute: 0 10*3/uL (ref 0.0–0.1)
Basophils Relative: 0 %
Eosinophils Absolute: 0.2 10*3/uL (ref 0.0–0.5)
Eosinophils Relative: 2 %
HCT: 42.9 % (ref 36.0–46.0)
Hemoglobin: 13.2 g/dL (ref 12.0–15.0)
Immature Granulocytes: 1 %
Lymphocytes Relative: 17 %
Lymphs Abs: 2 10*3/uL (ref 0.7–4.0)
MCH: 29.4 pg (ref 26.0–34.0)
MCHC: 30.8 g/dL (ref 30.0–36.0)
MCV: 95.5 fL (ref 80.0–100.0)
Monocytes Absolute: 0.7 10*3/uL (ref 0.1–1.0)
Monocytes Relative: 6 %
Neutro Abs: 8.5 10*3/uL — ABNORMAL HIGH (ref 1.7–7.7)
Neutrophils Relative %: 74 %
Platelets: 204 10*3/uL (ref 150–400)
RBC: 4.49 MIL/uL (ref 3.87–5.11)
RDW: 15 % (ref 11.5–15.5)
WBC: 11.5 10*3/uL — ABNORMAL HIGH (ref 4.0–10.5)
nRBC: 0 % (ref 0.0–0.2)

## 2020-01-01 LAB — BASIC METABOLIC PANEL
Anion gap: 10 (ref 5–15)
BUN: 14 mg/dL (ref 6–20)
CO2: 31 mmol/L (ref 22–32)
Calcium: 9 mg/dL (ref 8.9–10.3)
Chloride: 98 mmol/L (ref 98–111)
Creatinine, Ser: 0.54 mg/dL (ref 0.44–1.00)
GFR calc Af Amer: >60 mL/min (ref >60)
GFR calc non Af Amer: >60 mL/min (ref >60)
Glucose, Bld: 124 mg/dL — ABNORMAL HIGH (ref 70–99)
Potassium: 3.8 mmol/L (ref 3.5–5.1)
Sodium: 139 mmol/L (ref 135–145)

## 2020-01-01 MED ORDER — ALBUTEROL SULFATE (2.5 MG/3ML) 0.083% IN NEBU
2.5000 mg | INHALATION_SOLUTION | Freq: Once | RESPIRATORY_TRACT | Status: AC
  Administered 2020-01-01: 16:00:00 2.5 mg via RESPIRATORY_TRACT
  Filled 2020-01-01: qty 3

## 2020-01-01 MED ORDER — PREDNISONE 20 MG PO TABS: 40.0000 mg | ORAL_TABLET | Freq: Every day | ORAL | 0 refills | Status: AC

## 2020-01-01 MED ORDER — ALBUTEROL SULFATE (2.5 MG/3ML) 0.083% IN NEBU
2.5000 mg | INHALATION_SOLUTION | Freq: Once | RESPIRATORY_TRACT | Status: AC
  Administered 2020-01-01: 2.5 mg via RESPIRATORY_TRACT
  Filled 2020-01-01: qty 3

## 2020-01-01 MED ORDER — MORPHINE SULFATE (PF) 4 MG/ML IV SOLN
4.0000 mg | Freq: Once | INTRAVENOUS | Status: AC
  Administered 2020-01-01: 19:00:00 4 mg via INTRAVENOUS
  Filled 2020-01-01: qty 1

## 2020-01-01 MED ORDER — GLUCAGON HCL (RDNA) 1 MG IJ SOLR
1.0000 mg | Freq: Once | INTRAMUSCULAR | Status: AC | PRN
  Administered 2020-01-01: 16:00:00 1 mg via INTRAVENOUS

## 2020-01-01 MED ORDER — IOHEXOL 300 MG/ML  SOLN
75.0000 mL | Freq: Once | INTRAMUSCULAR | Status: AC | PRN
  Administered 2020-01-01: 18:00:00 75 mL via INTRAVENOUS

## 2020-01-01 MED ORDER — MORPHINE SULFATE (PF) 4 MG/ML IV SOLN
4.0000 mg | Freq: Once | INTRAVENOUS | Status: AC
  Administered 2020-01-01: 4 mg via INTRAVENOUS
  Filled 2020-01-01: qty 1

## 2020-01-01 MED ORDER — DIPHENHYDRAMINE HCL 50 MG/ML IJ SOLN
25.0000 mg | Freq: Once | INTRAMUSCULAR | Status: AC
  Administered 2020-01-01: 25 mg via INTRAVENOUS
  Filled 2020-01-01: qty 1

## 2020-01-01 MED ORDER — METHYLPREDNISOLONE SODIUM SUCC 125 MG IJ SOLR
125.0000 mg | Freq: Once | INTRAMUSCULAR | Status: AC
  Administered 2020-01-01: 125 mg via INTRAVENOUS
  Filled 2020-01-01: qty 2

## 2020-01-01 NOTE — ED Notes (Signed)
Pt states that the morphine did not help with her pain. Dr. Marisa Severin made aware.

## 2020-01-01 NOTE — ED Provider Notes (Signed)
[facility] Emergency Department Provider Note ____________________________________________   First MD Initiated Contact with Patient 01/01/20 1615     (approximate)  I have reviewed the triage vital signs and the nursing notes.   HISTORY  Chief Complaint Airway Obstruction     HPI Connie Koch is a 44 y.o. female with PMH as noted above who presents with throat discomfort and shortness of breath, acute onset about an hour ago when she swallowed a fluoxetine pill.  The patient states like the pill went down the wrong place and she started trying to cough it up.  The patient states that she has felt short of breath since that time and lost her voice, although the shortness of breath is now improved a bit.  She states that she was feeling fine before she took the pill.  She denies any chest pain.  Past Medical History:  Diagnosis Date  . Apnea   . Asthma   . Chronic pain   . Cyst of brain   . PCOS (polycystic ovarian syndrome)   . PTSD (post-traumatic stress disorder)     Patient Active Problem List   Diagnosis Date Noted  . PTSD (post-traumatic stress disorder) 05/13/2018  . Adjustment disorder with anxiety 05/13/2018    Past Surgical History:  Procedure Laterality Date  . BACK SURGERY    . KIDNEY SURGERY    . NECK SURGERY      Prior to Admission medications   Medication Sig Start Date End Date Taking? Authorizing Provider  FLUoxetine (PROZAC) 20 MG capsule Take 1 capsule (20 mg total) by mouth daily. 05/13/18   Clapacs, Madie Reno, MD  hydrOXYzine (ATARAX/VISTARIL) 10 MG tablet Take 2 tablets (20 mg total) by mouth every 12 (twelve) hours as needed for anxiety. 05/13/18   Clapacs, Madie Reno, MD  predniSONE (DELTASONE) 20 MG tablet Take 2 tablets (40 mg total) by mouth daily for 3 days. 01/01/20 01/04/20  Arta Silence, MD  SUMAtriptan (IMITREX) 20 MG/ACT nasal spray Place 20 mg into the nose every 2 (two) hours as needed for migraine  or headache. May repeat in 2 hours if headache persists or recurs.    [provider]  traMADol (ULTRAM) 50 MG tablet Take 1 tablet (50 mg total) by mouth every 6 (six) hours as needed. 03/17/18   Johnn Hai, PA-C    Allergies Nsaids, Latex, and Penicillins  Family History  Problem Relation Age of Onset  . Breast cancer Paternal Grandmother     Social History Social History   Tobacco Use  . Smoking status: Current Every Day Smoker    Packs/day: 0.50    Types: Cigarettes  . Smokeless tobacco: Never Used  Substance Use Topics  . Alcohol use: Yes  . Drug use: No    Review of Systems  Constitutional: No fever. Eyes: No redness. ENT: Positive for throat pain. Cardiovascular: Denies chest pain. Respiratory: Positive for shortness of breath. Gastrointestinal: No vomiting or diarrhea.  Genitourinary: Negative for flank pain Musculoskeletal: Negative for back pain. Skin: Negative for rash. Neurological: Negative for headache.   ____________________________________________   PHYSICAL EXAM:  VITAL SIGNS: ED Triage Vitals  Enc Vitals Group     BP 01/01/20 1620 (!) 150/97     Pulse Rate 01/01/20 1620 (!) 104     Resp 01/01/20 1620 19     Temp --      Temp src --      SpO2 01/01/20 1620 94 %  Weight 01/01/20 1611 288 lb 12.8 oz (131 kg)     Height 01/01/20 1611 5\' 4"  (1.626 m)     Head Circumference --      Peak Flow --      Pain Score 01/01/20 1611 9     Pain Loc --      Pain Edu? --      Excl. in GC? --     Constitutional: Alert and oriented.  Slightly uncomfortable appearing but in no acute distress. Eyes: Conjunctivae are normal.  Head: Atraumatic. Nose: No congestion/rhinnorhea. Mouth/Throat: Mucous membranes are moist.  Uvula and posterior oropharynx with mild edema.  Hoarse voice.  No stridor or pooled secretions. Neck: Normal range of motion.  Cardiovascular: Normal rate, regular rhythm. Grossly normal heart sounds.  Good peripheral  circulation. Respiratory: Increased respiratory effort.  No retractions. Faint scattered wheezes bilaterally. Gastrointestinal: No distention.  Musculoskeletal: Extremities warm and well perfused.  Neurologic: Motor intact in all extremities. Skin:  Skin is warm and dry. No rash noted. Psychiatric: Mood and affect are normal. Speech and behavior are normal.  ____________________________________________   LABS (all labs ordered are listed, but only abnormal results are displayed)  Labs Reviewed  CBC WITH DIFFERENTIAL/PLATELET - Abnormal; Notable for the following components:      Result Value   WBC 11.5 (*)    Neutro Abs 8.5 (*)    Abs Immature Granulocytes 0.11 (*)    All other components within normal limits  BASIC METABOLIC PANEL - Abnormal; Notable for the following components:   Glucose, Bld 124 (*)    All other components within normal limits   ____________________________________________  EKG  ED ECG REPORT I, 02/29/20, the attending physician, personally viewed and interpreted this ECG.  Date: 01/01/2020 EKG Time: 1650 Rate: 100 Rhythm: normal sinus rhythm QRS Axis: Right axis Intervals: normal ST/T Wave abnormalities: normal Narrative Interpretation: no evidence of acute ischemia  ____________________________________________  RADIOLOGY  CXR: No focal infiltrate or other acute abnormality CT neck: Mild prominence of adenoids and tonsils with no obstruction or other acute abnormality  ____________________________________________   PROCEDURES  Procedure(s) performed: No  Procedures  Critical Care performed: Yes  CRITICAL CARE Performed by: 02/29/2020   Total critical care time: 35 minutes  Critical care time was exclusive of separately billable procedures and treating other patients.  Critical care was necessary to treat or prevent imminent or life-threatening deterioration.  Critical care was time spent personally by me on  the following activities: development of treatment plan with patient and/or surrogate as well as nursing, discussions with consultants, evaluation of patient's response to treatment, examination of patient, obtaining history from patient or surrogate, ordering and performing treatments and interventions, ordering and review of laboratory studies, ordering and review of radiographic studies, pulse oximetry and re-evaluation of patient's condition. ____________________________________________   INITIAL IMPRESSION / ASSESSMENT AND PLAN / ED COURSE  Pertinent labs & imaging results that were available during my care of the patient were reviewed by me and considered in my medical decision making (see chart for details).  44 year old female with PMH as noted above presents with acute onset shortness of breath and throat discomfort after swallowing a Floxin pill.  She felt like it went "into her sinuses" or down the wrong pipe in her throat and she started coughing.  The patient states that she drank some milk to try to get it down, but continues to have the discomfort.  However, she was able to tolerate the milk  and did not vomit.  On exam, the patient is slightly uncomfortable appearing with borderline tachycardia and O2 saturation around 94% on 3 L by nasal cannula.  Her other vital signs are normal.  She has mildly increased work of breathing at this time, but no acute respiratory distress and states that the breathing has improved.  She does have some uvular and posterior oropharyngeal swelling but no pooled secretions or stridor.  Given that this was a small pill, I do not suspect esophageal impaction.  At this point the pill should have dissolved.  It is possible that the patient aspirated the pill and/or some saliva.  Given the posterior oropharyngeal swelling and the report of significant coughing, I suspect that the patient is having some inflammation/edema due to her coughing and attempting to get  the pill back up.  However, at this time, she does not have an active airway obstruction and is tolerating her secretions.  We will obtain a chest x-ray, basic labs, give steroid, Benadryl, nebs, and reassess.  If the patient continues to have significant symptoms I may obtain a soft tissue CT of the neck.  ----------------------------------------- 9:08 PM on 01/01/2020 -----------------------------------------  After the initial treatment, the patient still had a hoarse voice and reported continued throat pain and shortness of breath.  O2 saturation remained around 90%.  Chest x-ray showed no infiltrate or other acute abnormality.  Therefore I obtained a CT of the neck which was also negative except for mild swelling of the adenoids and tonsils.  There was no evidence of airway obstruction.  The patient reports that her O2 saturation is often low at baseline, and she uses 5 L of oxygen at home at night although normally is able to be off oxygen during the day.  Given that her O2 saturation was in the low 90s on 2 L, I initially advised that we should admit her for further observation and to make sure that she was not developing aspiration pneumonia.  However, the patient stated that she is attending a family member's funeral tomorrow and has a strong preference to go home if at all possible.  I suggested that we observe her for an additional few hours and reassess, and she agreed with this plan.  Now on reassessment, the patient's O2 saturation is in the low 90s on room air.  Her voice is much stronger and clearer, and she reports that her symptoms have significantly improved.  Given the negative chest x-ray and CT and the patient's clinical improvement, there is no evidence of developing aspiration pneumonia, and there continues to be no evidence of airway obstruction.  At this time, discharge is reasonable, and this is still the patient's strong preference.  I counseled her on the results of the  work-up.  I will give an additional short course of steroid.  I gave her very thorough return precautions and she expressed understanding.  ____________________________________________   FINAL CLINICAL IMPRESSION(S) / ED DIAGNOSES  Final diagnoses:  Uvular edema      NEW MEDICATIONS STARTED DURING THIS VISIT:  New Prescriptions   PREDNISONE (DELTASONE) 20 MG TABLET    Take 2 tablets (40 mg total) by mouth daily for 3 days.     Note:  This document was prepared using Dragon voice recognition software and may include unintentional dictation errors.    Dionne Bucy, MD 01/01/20 2112

## 2020-01-01 NOTE — ED Provider Notes (Signed)
[facility] ____________________________________________  Time seen: Approximately 4:15 PM  I have reviewed the triage vital signs and the nursing notes.   HISTORY  Chief Complaint trouble swallowing  HPI Connie Koch is a 44 y.o. female presenting for choking and trouble swallowing.  Patient reports just prior to arrival she was taking her to fluoxetine pills as normal, and choked.  States "they went up into my sinuses ".  Patient reports since trying to swallow the pills she has been choking, gagging and having difficulty breathing clearly.  Reports her roommate drove her to urgent care.  Denies history of the same.  When asked if patient has any history of airway or esophageal disorders she said she is unable to be intubated.  Denies previous neck or throat surgery.  Reports shortness of breath.  No recent fever or sickness.  Denies alleviating measures.  Patient with hoarse voice and intermittently coughing in room, history limited.   Past Medical History:  Diagnosis Date  . Apnea   . Asthma   . Chronic pain   . Cyst of brain   . PCOS (polycystic ovarian syndrome)   . PTSD (post-traumatic stress disorder)     Patient Active Problem List   Diagnosis Date Noted  . PTSD (post-traumatic stress disorder) 05/13/2018  . Adjustment disorder with anxiety 05/13/2018    Past Surgical History:  Procedure Laterality Date  . BACK SURGERY    . KIDNEY SURGERY    . NECK SURGERY       No current facility-administered medications for this encounter.  Current Outpatient Medications:  .  SUMAtriptan (IMITREX) 20 MG/ACT nasal spray, Place 20 mg into the nose every 2 (two) hours as needed for migraine or headache. May repeat in 2 hours if headache persists or recurs., Disp: , Rfl:  .  FLUoxetine (PROZAC) 20 MG capsule, Take 1 capsule (20 mg total) by mouth daily., Disp: 30 capsule, Rfl: 1 .  hydrOXYzine (ATARAX/VISTARIL) 10 MG tablet, Take 2 tablets (20 mg total)  by mouth every 12 (twelve) hours as needed for anxiety., Disp: 120 tablet, Rfl: 1 .  predniSONE (DELTASONE) 50 MG tablet, Take 1 tablet (50 mg total) by mouth daily with breakfast., Disp: 5 tablet, Rfl: 0 .  traMADol (ULTRAM) 50 MG tablet, Take 1 tablet (50 mg total) by mouth every 6 (six) hours as needed., Disp: 10 tablet, Rfl: 0  Allergies Nsaids, Latex, and Penicillins  Family History  Problem Relation Age of Onset  . Breast cancer Paternal Grandmother     Social History Social History   Tobacco Use  . Smoking status: Current Every Day Smoker    Packs/day: 0.50    Types: Cigarettes  . Smokeless tobacco: Never Used  Substance Use Topics  . Alcohol use: Yes  . Drug use: No    Review of Systems  Constitutional: No fever ENT: Positive difficulty swallowing Cardiovascular: Denies chest pain. Respiratory: Positive shortness of breath Gastrointestinal: Positive spitting up   ____________________________________________   PHYSICAL EXAM:  VITAL SIGNS: ED Triage Vitals  Enc Vitals Group     BP 01/01/20 1522 (!) 180/105     Pulse Rate 01/01/20 1522 (!) 110     Resp 01/01/20 1522 (!) 24     Temp 01/01/20 1522 98.8 F (37.1 C)     Temp Source 01/01/20 1522 Temporal     SpO2 01/01/20 1522 94 %     Weight 01/01/20 1520 288 lb 12.8 oz (131 kg)     Height  01/01/20 1520 5\' 4"  (1.626 m)     Head Circumference --      Peak Flow --      Pain Score 01/01/20 1520 8     Pain Loc --      Pain Edu? --      Excl. in GC? --     Constitutional: Alert and oriented. Appears anxious.  ENT      Head: Normocephalic and atraumatic.      Nose: Nasal congestion.  No foreign body visible.      Mouth/Throat: Mucous membranes are moist. No foreign body visible.  Neck: No stridor. Supple without meningismus.  Hematological/Lymphatic/Immunilogical: No cervical lymphadenopathy. Cardiovascular: Tachycardic.  Good peripheral circulation. Respiratory: Patient speaking in 3-4 word sentences.   Coarse rhonchi throughout.  Good air movement. Musculoskeletal: Steady gait. Neurologic:  Normal speech and language. Skin:  Skin is warm, dry and intact. No rash noted. Psychiatric: Mood and affect are normal. Speech and behavior are normal. Patient exhibits appropriate insight and judgment   ___________________________________________   LABS (all labs ordered are listed, but only abnormal results are displayed)  Labs Reviewed - No data to display   PROCEDURES Procedures   INITIAL IMPRESSION / ASSESSMENT AND PLAN / ED COURSE  Pertinent labs & imaging results that were available during my care of the patient were reviewed by me and considered in my medical decision making (see chart for details).  Patient presented to urgent care shortly after taking her fluoxetine pills and subsequently choking.  Patient sitting up, alert but does appear anxious and in mild distress, but continues to clearly communicate.  Patient 94% room air and able to speak in 3-4 word sentences clearly.  Patient is having intermittent coughing and spitting up of mucus.  Concern for obstruction.  EMS called immediately.  IV established and 1 mg IV glucagon administered after discussing risks and benefits with patient, patient agreed to plan.  Patient transferred by EMS to [facility].  Brandy RN charge nurse called and given report.  ____________________________________________   FINAL CLINICAL IMPRESSION(S) / ED DIAGNOSES  Final diagnoses:  Choking, initial encounter  Dysphagia, unspecified type     ED Discharge Orders    None       Note: This dictation was prepared with Dragon dictation along with smaller phrase technology. Any transcriptional errors that result from this process are unintentional.         02/29/20, NP 01/01/20 1649

## 2020-01-01 NOTE — ED Notes (Signed)
Pt alert, able to speak with effort. Pt states she still has throat pain and pain meds have not helped.

## 2020-01-01 NOTE — ED Triage Notes (Addendum)
Pt via EMS from [facility]. Pt states she cannot breathe. Pt said she swallowed a pill wrong and it feels like it hasn't gone down all the way. Pt is coughing on arrival and in notable distress. O2 sat on arrival are 94% on 3L.

## 2020-01-01 NOTE — Discharge Instructions (Addendum)
Take the prednisone as prescribed starting tomorrow and finish the full course.  Continue to use your oxygen at night as usual.  Return to the ER immediately for new, worsening, or persistent throat discomfort, shortness of breath, loss of your voice, difficulty swallowing, fever, cough, or any other new or worsening symptoms that concern you.

## 2020-01-01 NOTE — ED Triage Notes (Signed)
Pt states 2 pills just PTA and they "went up into my sinuses." States she feels like she can't breathe. Pt coughing up phlegm. VSS in triage.

## 2020-01-03 ENCOUNTER — Other Ambulatory Visit: Payer: Self-pay

## 2020-01-03 ENCOUNTER — Emergency Department
Admission: EM | Admit: 2020-01-03 | Discharge: 2020-01-03 | Disposition: A | Discharge status: Home / Self Care | Payer: Self-pay | Attending: Emergency Medicine | Admitting: Emergency Medicine

## 2020-01-03 DIAGNOSIS — Z91040 Latex allergy status: Secondary | ICD-10-CM | POA: Insufficient documentation

## 2020-01-03 DIAGNOSIS — R05 Cough: Secondary | ICD-10-CM | POA: Insufficient documentation

## 2020-01-03 DIAGNOSIS — J029 Acute pharyngitis, unspecified: Secondary | ICD-10-CM | POA: Insufficient documentation

## 2020-01-03 DIAGNOSIS — F17210 Nicotine dependence, cigarettes, uncomplicated: Secondary | ICD-10-CM | POA: Insufficient documentation

## 2020-01-03 DIAGNOSIS — J45909 Unspecified asthma, uncomplicated: Secondary | ICD-10-CM | POA: Insufficient documentation

## 2020-01-03 MED ORDER — MAGIC MOUTHWASH
10.0000 mL | Freq: Once | ORAL | Status: AC
  Administered 2020-01-03: 10 mL via ORAL
  Filled 2020-01-03: qty 10

## 2020-01-03 MED ORDER — HYDROCOD POLST-CPM POLST ER 10-8 MG/5ML PO SUER
5.0000 mL | Freq: Once | ORAL | Status: AC
  Administered 2020-01-03: 5 mL via ORAL
  Filled 2020-01-03: qty 5

## 2020-01-03 MED ORDER — LIDOCAINE VISCOUS HCL 2 % MT SOLN
15.0000 mL | Freq: Once | OROMUCOSAL | Status: AC
  Administered 2020-01-03: 16:00:00 15 mL via OROMUCOSAL
  Filled 2020-01-03: qty 15

## 2020-01-03 MED ORDER — LIDOCAINE VISCOUS HCL 2 % MT SOLN: 5.0000 mL | Freq: Four times a day (QID) | OROMUCOSAL | 0 refills | Status: DC | PRN

## 2020-01-03 MED ORDER — HYDROCOD POLST-CPM POLST ER 10-8 MG/5ML PO SUER: 5.0000 mL | Freq: Two times a day (BID) | ORAL | 0 refills | Status: AC

## 2020-01-03 MED ORDER — LIDOCAINE VISCOUS HCL 2 % MT SOLN: 5.0000 mL | Freq: Four times a day (QID) | OROMUCOSAL | 0 refills | Status: AC | PRN

## 2020-01-03 MED ORDER — HYDROCOD POLST-CPM POLST ER 10-8 MG/5ML PO SUER: 5.0000 mL | Freq: Two times a day (BID) | ORAL | 0 refills | Status: DC

## 2020-01-03 NOTE — ED Triage Notes (Signed)
Pt was seen at urgent care 2 days ago with cough, sore throat , pt states she continues to have discomfort and states it came from swallowing her anxiety medicine wrong. Pt is in NAD at present.

## 2020-01-03 NOTE — ED Provider Notes (Signed)
[facility] Emergency Department Provider Note   ____________________________________________   First MD Initiated Contact with Patient 01/03/20 1547     (approximate)  I have reviewed the triage vital signs and the nursing notes.   HISTORY  Chief Complaint Sore Throat    HPI Connie Koch is a 44 y.o. female patient complaining of 2 days of cough and sore throat.  Patient states sore throat discomfort secondary to swallowing a pill wrong a few days ago.  Patient had CT scan of her neck which showed no abnormalities.  Patient continues have difficulty with swallowing.  Patient state cough and also aggravates her sore throat.  Patient rates the pain as a 7/10.  Patient described the pain as "achy".  Patient is able to tolerate soft food and fluids.         Past Medical History:  Diagnosis Date  . Apnea   . Asthma   . Chronic pain   . Cyst of brain   . PCOS (polycystic ovarian syndrome)   . PTSD (post-traumatic stress disorder)     Patient Active Problem List   Diagnosis Date Noted  . PTSD (post-traumatic stress disorder) 05/13/2018  . Adjustment disorder with anxiety 05/13/2018    Past Surgical History:  Procedure Laterality Date  . BACK SURGERY    . KIDNEY SURGERY    . NECK SURGERY      Prior to Admission medications   Medication Sig Start Date End Date Taking? Authorizing Provider  chlorpheniramine-HYDROcodone (TUSSIONEX PENNKINETIC ER) 10-8 MG/5ML SUER Take 5 mLs by mouth 2 (two) times daily. 01/03/20   Joni Reining, PA-C  FLUoxetine (PROZAC) 20 MG capsule Take 1 capsule (20 mg total) by mouth daily. 05/13/18   Clapacs, Jackquline Denmark, MD  hydrOXYzine (ATARAX/VISTARIL) 10 MG tablet Take 2 tablets (20 mg total) by mouth every 12 (twelve) hours as needed for anxiety. 05/13/18   Clapacs, Jackquline Denmark, MD  lidocaine (XYLOCAINE) 2 % solution Use as directed 5 mLs in the mouth or throat every 6 (six) hours as needed for mouth pain. Oral swish  and swallow 01/03/20   Joni Reining, PA-C  predniSONE (DELTASONE) 20 MG tablet Take 2 tablets (40 mg total) by mouth daily for 3 days. 01/01/20 01/04/20  Dionne Bucy, MD  SUMAtriptan (IMITREX) 20 MG/ACT nasal spray Place 20 mg into the nose every 2 (two) hours as needed for migraine or headache. May repeat in 2 hours if headache persists or recurs.    [provider]  traMADol (ULTRAM) 50 MG tablet Take 1 tablet (50 mg total) by mouth every 6 (six) hours as needed. 03/17/18   Tommi Rumps, PA-C    Allergies Nsaids, Latex, and Penicillins  Family History  Problem Relation Age of Onset  . Breast cancer Paternal Grandmother     Social History Social History   Tobacco Use  . Smoking status: Current Every Day Smoker    Packs/day: 0.50    Types: Cigarettes  . Smokeless tobacco: Never Used  Substance Use Topics  . Alcohol use: Yes  . Drug use: No    Review of Systems  Constitutional: No fever/chills Eyes: No visual changes. ENT: No sore throat. Cardiovascular: Denies chest pain. Respiratory: Denies shortness of breath. Gastrointestinal: No abdominal pain.  No nausea, no vomiting.  No diarrhea.  No constipation. Genitourinary: Negative for dysuria. Musculoskeletal: Negative for back pain. Skin: Negative for rash. Neurological: Negative for headaches, focal weakness or numbness. Psychiatric:  Adjustment disorder  and PTSD. Allergic/Immunilogical: Latex, NSAIDs, and penicillin. ____________________________________________   PHYSICAL EXAM:  VITAL SIGNS: ED Triage Vitals  Enc Vitals Group     BP 01/03/20 1513 (!) 161/85     Pulse Rate 01/03/20 1513 86     Resp 01/03/20 1513 20     Temp 01/03/20 1513 98.7 F (37.1 C)     Temp Source 01/03/20 1513 Oral     SpO2 01/03/20 1513 93 %     Weight 01/03/20 1514 290 lb (131.5 kg)     Height 01/03/20 1514 5\' 2"  (1.575 m)     Head Circumference --      Peak Flow --      Pain Score 01/03/20 1518 7     Pain Loc  --      Pain Edu? --      Excl. in [place]? --     Constitutional: Alert and oriented. Well appearing and in no acute distress.  Morbid obesity. Eyes: Conjunctivae are normal. PERRL. EOMI. Head: Atraumatic. Nose: No congestion/rhinnorhea. Mouth/Throat: Mucous membranes are moist.  Oropharynx non-erythematous. Neck: No stridor.   Hematological/Lymphatic/Immunilogical: No cervical lymphadenopathy. Cardiovascular: Normal rate, regular rhythm. Grossly normal heart sounds.  Good peripheral circulation. Respiratory: Normal respiratory effort.  No retractions. Lungs CTAB.  Nonproductive cough. Neurologic:  Normal speech and language. No gross focal neurologic deficits are appreciated. No gait instability. Skin:  Skin is warm, dry and intact. No rash noted. Psychiatric: Mood and affect are normal. Speech and behavior are normal.  ____________________________________________   LABS (all labs ordered are listed, but only abnormal results are displayed)  Labs Reviewed - No data to display ____________________________________________  EKG   ____________________________________________  RADIOLOGY  ED MD interpretation:    Official radiology report(s): No results found.  ____________________________________________   PROCEDURES  Procedure(s) performed (including Critical Care):  Procedures   ____________________________________________   INITIAL IMPRESSION / ASSESSMENT AND PLAN / ED COURSE  As part of my medical decision making, I reviewed the following data within the electronic MEDICAL RECORD NUMBER   Sore throat secondary irritation.  Patient given discharge care instructions and advised take medication as directed.  Patient advised follow-up PCP.    Connie Koch was evaluated in Emergency Department on 01/03/2020 for the symptoms described in the history of present illness. She was evaluated in the context of the global COVID-19 pandemic, which necessitated  consideration that the patient might be at risk for infection with the SARS-CoV-2 virus that causes COVID-19. Institutional protocols and algorithms that pertain to the evaluation of patients at risk for COVID-19 are in a state of rapid change based on information released by regulatory bodies including the CDC and federal and state organizations. These policies and algorithms were followed during the patient's care in the ED.       ____________________________________________   FINAL CLINICAL IMPRESSION(S) / ED DIAGNOSES  Final diagnoses:  Sore throat     ED Discharge Orders         Ordered    chlorpheniramine-HYDROcodone (TUSSIONEX PENNKINETIC ER) 10-8 MG/5ML SUER  2 times daily     01/03/20 1602    lidocaine (XYLOCAINE) 2 % solution  Every 6 hours PRN     01/03/20 1604           Note:  This document was prepared using Dragon voice recognition software and may include unintentional dictation errors.    Sable Feil, PA-C 01/03/20 1612    Carrie Mew, MD 01/04/20 2052

## 2020-01-03 NOTE — Discharge Instructions (Addendum)
Follow discharge care instruction take medication as directed.  Continue previous medications.

## 2020-01-03 NOTE — ED Notes (Signed)
Pt with sob, wheezing. sats 90% RA. Placed on [facility]. sats 93%. Hx pill aspiration, asthma.

## 2020-02-08 ENCOUNTER — Emergency Department: Payer: Self-pay

## 2020-02-08 ENCOUNTER — Other Ambulatory Visit: Payer: Self-pay

## 2020-02-08 ENCOUNTER — Encounter: Payer: Self-pay | Admitting: Emergency Medicine

## 2020-02-08 ENCOUNTER — Emergency Department
Admission: EM | Admit: 2020-02-08 | Discharge: 2020-02-09 | Disposition: A | Discharge status: Home / Self Care | Payer: Self-pay | Attending: Emergency Medicine | Admitting: Emergency Medicine

## 2020-02-08 DIAGNOSIS — F17210 Nicotine dependence, cigarettes, uncomplicated: Secondary | ICD-10-CM | POA: Insufficient documentation

## 2020-02-08 DIAGNOSIS — Z91040 Latex allergy status: Secondary | ICD-10-CM | POA: Insufficient documentation

## 2020-02-08 DIAGNOSIS — J45909 Unspecified asthma, uncomplicated: Secondary | ICD-10-CM | POA: Insufficient documentation

## 2020-02-08 DIAGNOSIS — N202 Calculus of kidney with calculus of ureter: Secondary | ICD-10-CM | POA: Insufficient documentation

## 2020-02-08 DIAGNOSIS — Z79899 Other long term (current) drug therapy: Secondary | ICD-10-CM | POA: Insufficient documentation

## 2020-02-08 DIAGNOSIS — N200 Calculus of kidney: Secondary | ICD-10-CM

## 2020-02-08 LAB — CBC WITH DIFFERENTIAL/PLATELET
Abs Immature Granulocytes: 0.05 10*3/uL (ref 0.00–0.07)
Basophils Absolute: 0 10*3/uL (ref 0.0–0.1)
Basophils Relative: 0 %
Eosinophils Absolute: 0.2 10*3/uL (ref 0.0–0.5)
Eosinophils Relative: 2 %
HCT: 41.7 % (ref 36.0–46.0)
Hemoglobin: 12.8 g/dL (ref 12.0–15.0)
Immature Granulocytes: 1 %
Lymphocytes Relative: 26 %
Lymphs Abs: 2.7 10*3/uL (ref 0.7–4.0)
MCH: 29 pg (ref 26.0–34.0)
MCHC: 30.7 g/dL (ref 30.0–36.0)
MCV: 94.3 fL (ref 80.0–100.0)
Monocytes Absolute: 0.6 10*3/uL (ref 0.1–1.0)
Monocytes Relative: 6 %
Neutro Abs: 6.6 10*3/uL (ref 1.7–7.7)
Neutrophils Relative %: 65 %
Platelets: 196 10*3/uL (ref 150–400)
RBC: 4.42 MIL/uL (ref 3.87–5.11)
RDW: 14.7 % (ref 11.5–15.5)
WBC: 10.1 10*3/uL (ref 4.0–10.5)
nRBC: 0 % (ref 0.0–0.2)

## 2020-02-08 LAB — COMPREHENSIVE METABOLIC PANEL
ALT: 21 U/L (ref 0–44)
AST: 16 U/L (ref 15–41)
Albumin: 3.6 g/dL (ref 3.5–5.0)
Alkaline Phosphatase: 57 U/L (ref 38–126)
Anion gap: 9 (ref 5–15)
BUN: 15 mg/dL (ref 6–20)
CO2: 28 mmol/L (ref 22–32)
Calcium: 9.1 mg/dL (ref 8.9–10.3)
Chloride: 105 mmol/L (ref 98–111)
Creatinine, Ser: 0.56 mg/dL (ref 0.44–1.00)
GFR calc Af Amer: >60 mL/min (ref >60)
GFR calc non Af Amer: >60 mL/min (ref >60)
Glucose, Bld: 120 mg/dL — ABNORMAL HIGH (ref 70–99)
Potassium: 3.7 mmol/L (ref 3.5–5.1)
Sodium: 142 mmol/L (ref 135–145)
Total Bilirubin: 0.4 mg/dL (ref 0.3–1.2)
Total Protein: 7.4 g/dL (ref 6.5–8.1)

## 2020-02-08 LAB — URINALYSIS, COMPLETE (UACMP) WITH MICROSCOPIC
Bacteria, UA: NONE SEEN
Bilirubin Urine: NEGATIVE
Glucose, UA: NEGATIVE mg/dL
Hgb urine dipstick: NEGATIVE
Ketones, ur: NEGATIVE mg/dL
Leukocytes,Ua: NEGATIVE
Nitrite: NEGATIVE
Protein, ur: NEGATIVE mg/dL
Specific Gravity, Urine: 1.023 (ref 1.005–1.030)
pH: 5 (ref 5.0–8.0)

## 2020-02-08 LAB — POCT PREGNANCY, URINE: Preg Test, Ur: NEGATIVE

## 2020-02-08 MED ORDER — TRAMADOL HCL 50 MG PO TABS
50.0000 mg | ORAL_TABLET | Freq: Once | ORAL | Status: AC
  Administered 2020-02-09: 50 mg via ORAL
  Filled 2020-02-08: qty 1

## 2020-02-08 NOTE — ED Provider Notes (Signed)
[facility] Emergency Department Provider Note   ____________________________________________    I have reviewed the triage vital signs and the nursing notes.   HISTORY  Chief Complaint Flank Pain     HPI Connie Koch is a 44 y.o. female with a history of chronic pain, kidney stones who presents with right flank pain.  Patient reports this is been ongoing for 3 days and was intermittent at first but now is constant she reports it is moderate pain.  She has taken Tylenol with no improvement.  Denies fevers or chills.  No dysuria.  No hematuria.  Nausea no vomiting.  Positive diarrhea.  Past Medical History:  Diagnosis Date  . Apnea   . Asthma   . Chronic pain   . Cyst of brain   . PCOS (polycystic ovarian syndrome)   . PTSD (post-traumatic stress disorder)     Patient Active Problem List   Diagnosis Date Noted  . PTSD (post-traumatic stress disorder) 05/13/2018  . Adjustment disorder with anxiety 05/13/2018    Past Surgical History:  Procedure Laterality Date  . BACK SURGERY    . KIDNEY SURGERY    . NECK SURGERY      Prior to Admission medications   Medication Sig Start Date End Date Taking? Authorizing Provider  chlorpheniramine-HYDROcodone (TUSSIONEX PENNKINETIC ER) 10-8 MG/5ML SUER Take 5 mLs by mouth 2 (two) times daily. 01/03/20   Joni Reining, PA-C  FLUoxetine (PROZAC) 20 MG capsule Take 1 capsule (20 mg total) by mouth daily. 05/13/18   Clapacs, Jackquline Denmark, MD  hydrOXYzine (ATARAX/VISTARIL) 10 MG tablet Take 2 tablets (20 mg total) by mouth every 12 (twelve) hours as needed for anxiety. 05/13/18   Clapacs, Jackquline Denmark, MD  lidocaine (XYLOCAINE) 2 % solution Use as directed 5 mLs in the mouth or throat every 6 (six) hours as needed for mouth pain. For oral swish and swallow 01/03/20   Joni Reining, PA-C  ondansetron (ZOFRAN ODT) 4 MG disintegrating tablet Take 1 tablet (4 mg total) by mouth every 8 (eight) hours as needed.  02/09/20   Jene Every, MD  SUMAtriptan (IMITREX) 20 MG/ACT nasal spray Place 20 mg into the nose every 2 (two) hours as needed for migraine or headache. May repeat in 2 hours if headache persists or recurs.    [provider]  tamsulosin (FLOMAX) 0.4 MG CAPS capsule Take 1 capsule (0.4 mg total) by mouth daily. 02/09/20   Jene Every, MD  traMADol (ULTRAM) 50 MG tablet Take 1 tablet (50 mg total) by mouth every 6 (six) hours as needed. 02/09/20 02/08/21  Jene Every, MD     Allergies Nsaids, Latex, and Penicillins  Family History  Problem Relation Age of Onset  . Breast cancer Paternal Grandmother     Social History Social History   Tobacco Use  . Smoking status: Current Every Day Smoker    Packs/day: 0.50    Types: Cigarettes  . Smokeless tobacco: Never Used  Substance Use Topics  . Alcohol use: Yes  . Drug use: No    Review of Systems  Constitutional: No fever/chills Eyes: No visual changes.  ENT: No sore throat. Cardiovascular: Denies chest pain. Respiratory: Denies shortness of breath. Gastrointestinal: As above Genitourinary: As above Musculoskeletal: Chronic back pain Skin: Negative for rash. Neurological: Negative for headaches   ____________________________________________   PHYSICAL EXAM:  VITAL SIGNS: ED Triage Vitals  Enc Vitals Group     BP 02/08/20 2019 114/70  Pulse Rate 02/08/20 2019 (!) 104     Resp 02/08/20 2019 (!) 22     Temp 02/08/20 2019 98.2 F (36.8 C)     Temp Source 02/08/20 2019 Oral     SpO2 02/08/20 2019 93 %     Weight 02/08/20 2020 127 kg (280 lb)     Height --      Head Circumference --      Peak Flow --      Pain Score 02/08/20 2019 10     Pain Loc --      Pain Edu? --      Excl. in [place]? --     Constitutional: Alert and oriented.  Nose: No congestion/rhinnorhea. Mouth/Throat: Mucous membranes are moist.    Cardiovascular: Normal rate, regular rhythm.  Good peripheral circulation. Respiratory:  Normal respiratory effort.  No retractions Gastrointestinal: Soft and nontender. No distention.  No CVA tenderness.  Musculoskeletal:   Warm and well perfused Neurologic:  Normal speech and language. No gross focal neurologic deficits are appreciated.  Skin:  Skin is warm, dry and intact. No rash noted. Psychiatric: Mood and affect are normal. Speech and behavior are normal.  ____________________________________________   LABS (all labs ordered are listed, but only abnormal results are displayed)  Labs Reviewed  COMPREHENSIVE METABOLIC PANEL - Abnormal; Notable for the following components:      Result Value   Glucose, Bld 120 (*)    All other components within normal limits  URINALYSIS, COMPLETE (UACMP) WITH MICROSCOPIC - Abnormal; Notable for the following components:   Color, Urine YELLOW (*)    APPearance HAZY (*)    All other components within normal limits  CBC WITH DIFFERENTIAL/PLATELET  POCT PREGNANCY, URINE   ____________________________________________  EKG  None ____________________________________________  RADIOLOGY  CT renal stone study ____________________________________________   PROCEDURES  Procedure(s) performed: No  Procedures   Critical Care performed: No ____________________________________________   INITIAL IMPRESSION / ASSESSMENT AND PLAN / ED COURSE  Pertinent labs & imaging results that were available during my care of the patient were reviewed by me and considered in my medical decision making (see chart for details).  Patient presents with right flank pain, urinalysis is reassuring, no hemoglobin or evidence for infection.  However symptoms most concerning for kidney stone, will send for CT renal stone study, p.o. analgesics.  Lab work including CMP and CBC is unremarkable.  CT demonstrates punctate stone right UVJ, urinalysis negative for infection.  Analgesics, nausea medication and Flomax Rx given.      ____________________________________________   FINAL CLINICAL IMPRESSION(S) / ED DIAGNOSES  Final diagnoses:  Kidney stone        Note:  This document was prepared using Dragon voice recognition software and may include unintentional dictation errors.   Lavonia Drafts, MD 02/09/20 [phone]

## 2020-02-08 NOTE — ED Triage Notes (Signed)
Patient ambulatory to triage with steady gait, without difficulty or distress noted, mask in place; pt reports rt flank pain radiating around into lower abd with no accomp symptoms x 4 days; st hx kidney stones

## 2020-02-09 MED ORDER — TRAMADOL HCL 50 MG PO TABS: 50.0000 mg | ORAL_TABLET | Freq: Four times a day (QID) | ORAL | 0 refills | Status: AC | PRN

## 2020-02-09 MED ORDER — TAMSULOSIN HCL 0.4 MG PO CAPS: 0.4000 mg | ORAL_CAPSULE | Freq: Every day | ORAL | 0 refills | Status: AC

## 2020-02-09 MED ORDER — ONDANSETRON 4 MG PO TBDP: 4.0000 mg | ORAL_TABLET | Freq: Three times a day (TID) | ORAL | 0 refills | Status: AC | PRN

## 2020-03-22 DEATH — deceased
